# Patient Record
Sex: Female | Born: 1980 | Race: White | Hispanic: No | Marital: Married | Smoking: Never smoker
Health system: Southern US, Community
[De-identification: ages and names within clinical notes are randomized; demographics above are authoritative.]

## PROBLEM LIST (undated history)

## (undated) DIAGNOSIS — T7840XA Allergy, unspecified, initial encounter: Secondary | ICD-10-CM

## (undated) DIAGNOSIS — Z9851 Tubal ligation status: Secondary | ICD-10-CM

## (undated) DIAGNOSIS — N3020 Other chronic cystitis without hematuria: Secondary | ICD-10-CM

## (undated) DIAGNOSIS — B019 Varicella without complication: Secondary | ICD-10-CM

## (undated) DIAGNOSIS — N393 Stress incontinence (female) (male): Secondary | ICD-10-CM

## (undated) DIAGNOSIS — O4700 False labor before 37 completed weeks of gestation, unspecified trimester: Secondary | ICD-10-CM

## (undated) DIAGNOSIS — Z9882 Breast implant status: Secondary | ICD-10-CM

## (undated) DIAGNOSIS — A6000 Herpesviral infection of urogenital system, unspecified: Secondary | ICD-10-CM

## (undated) DIAGNOSIS — H9390 Unspecified disorder of ear, unspecified ear: Secondary | ICD-10-CM

## (undated) DIAGNOSIS — R011 Cardiac murmur, unspecified: Secondary | ICD-10-CM

## (undated) DIAGNOSIS — Z0000 Encounter for general adult medical examination without abnormal findings: Secondary | ICD-10-CM

## (undated) DIAGNOSIS — H6692 Otitis media, unspecified, left ear: Secondary | ICD-10-CM

## (undated) HISTORY — DX: Stress incontinence (female) (male): N39.3

## (undated) HISTORY — DX: Unspecified disorder of ear, unspecified ear: H93.90

## (undated) HISTORY — DX: Varicella without complication: B01.9

## (undated) HISTORY — DX: Herpesviral infection of urogenital system, unspecified: A60.00

## (undated) HISTORY — DX: Encounter for general adult medical examination without abnormal findings: Z00.00

## (undated) HISTORY — DX: Allergy, unspecified, initial encounter: T78.40XA

## (undated) HISTORY — DX: False labor before 37 completed weeks of gestation, unspecified trimester: O47.00

## (undated) HISTORY — DX: Otitis media, unspecified, left ear: H66.92

## (undated) HISTORY — DX: Cardiac murmur, unspecified: R01.1

## (undated) HISTORY — DX: Other chronic cystitis without hematuria: N30.20

---
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
## 2004-09-24 ENCOUNTER — Other Ambulatory Visit: Admission: RE | Admit: 2004-09-24 | Discharge: 2004-09-24 | Payer: Self-pay | Admitting: Obstetrics and Gynecology

## 2006-04-04 ENCOUNTER — Other Ambulatory Visit: Admission: RE | Admit: 2006-04-04 | Discharge: 2006-04-04 | Payer: Self-pay | Admitting: Obstetrics and Gynecology

## 2006-08-01 HISTORY — PX: BREAST ENHANCEMENT SURGERY: SHX7

## 2006-11-02 ENCOUNTER — Encounter: Admission: RE | Admit: 2006-11-02 | Discharge: 2006-11-02 | Payer: Self-pay | Admitting: Internal Medicine

## 2007-07-19 ENCOUNTER — Other Ambulatory Visit: Admission: RE | Admit: 2007-07-19 | Discharge: 2007-07-19 | Payer: Self-pay | Admitting: Obstetrics and Gynecology

## 2008-03-01 IMAGING — CT CT ABDOMEN W/ CM
2 of 5 series · 17 of 46 positions shown, 19 images · IV contrast (omnipaque)
Comparison: none

CLINICAL DATA: 25-year-old female, epigastric abdominal pain and nausea for 6 weeks. 
ABDOMEN CT WITH CONTRAST:
TECHNIQUE: Multidetector CT imaging of the abdomen was performed following the standard protocol during bolus administration of intravenous contrast.
Contrast:  100 cc Omnipaque 300
TECHNIQUE: Multidetector CT imaging of the pelvis was performed following the standard protocol during bolus administration of intravenous contrast.

[Series 3: routine abdomen · axial · 0.62mm/px · z∈[-436,-66]mm · 14 of 84 slices shown, 16 images]
[im 5/84  soft-tissue]
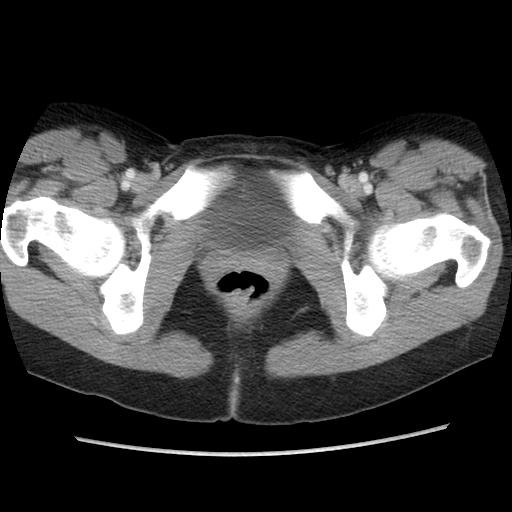
[im 5/84  bone]
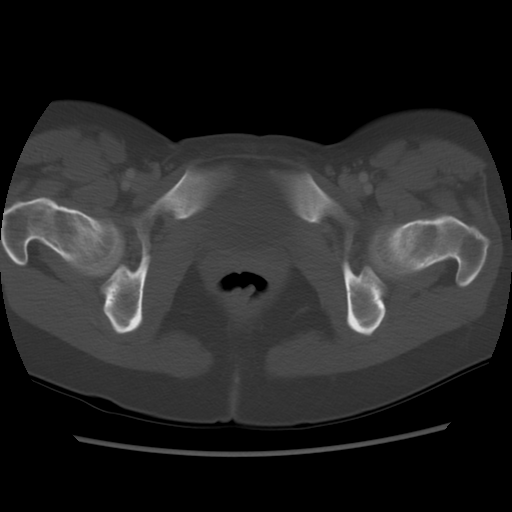
[im 10/84  soft-tissue]
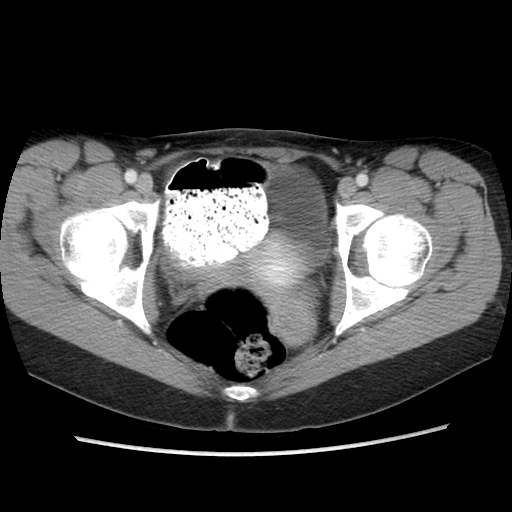
[im 15/84  soft-tissue]
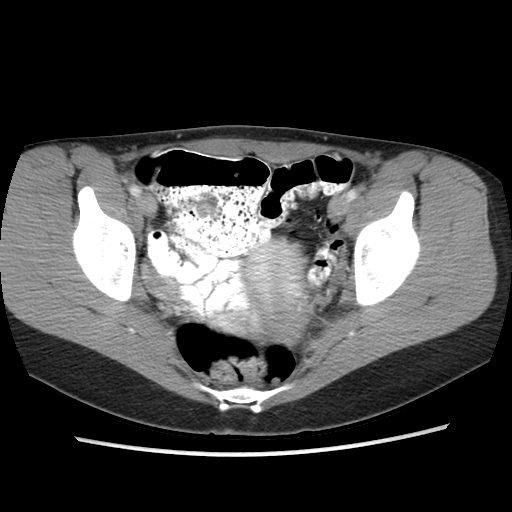
[im 25/84  soft-tissue]
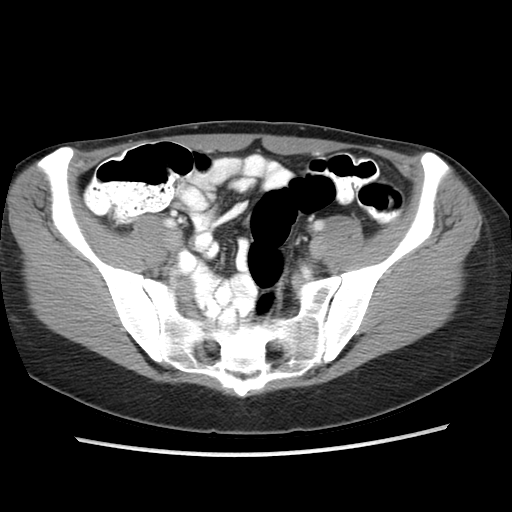
[im 30/84  soft-tissue]
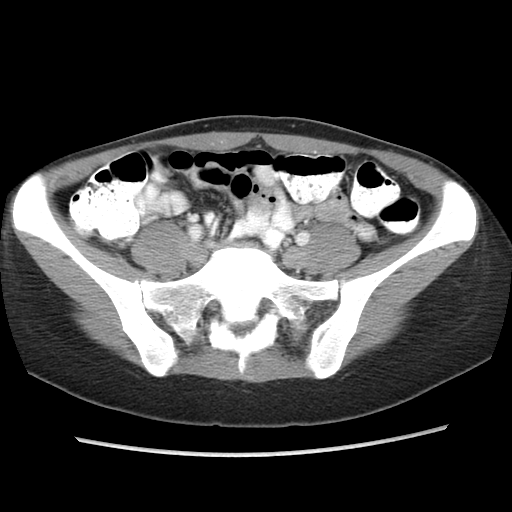
[im 35/84  soft-tissue]
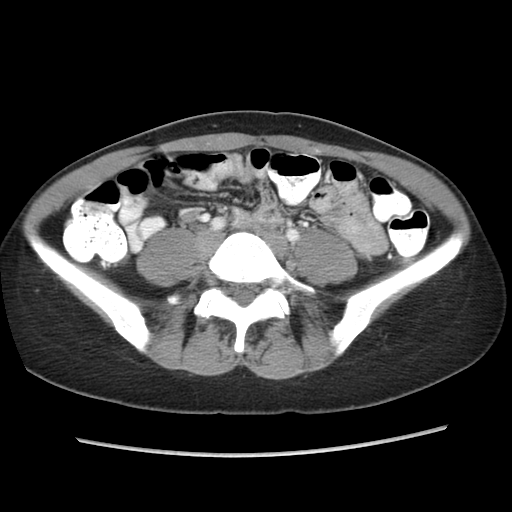
[im 40/84  soft-tissue]
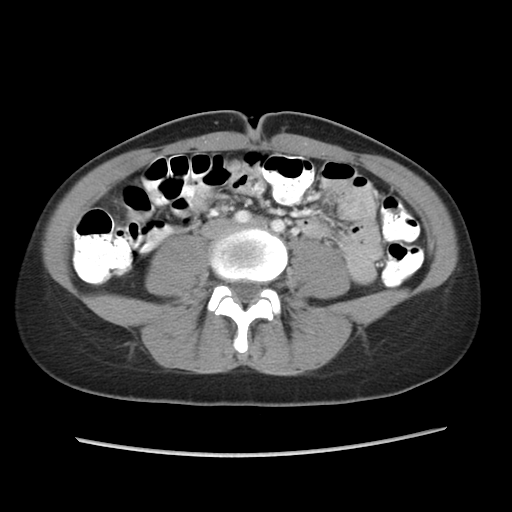
[im 44/84  soft-tissue]
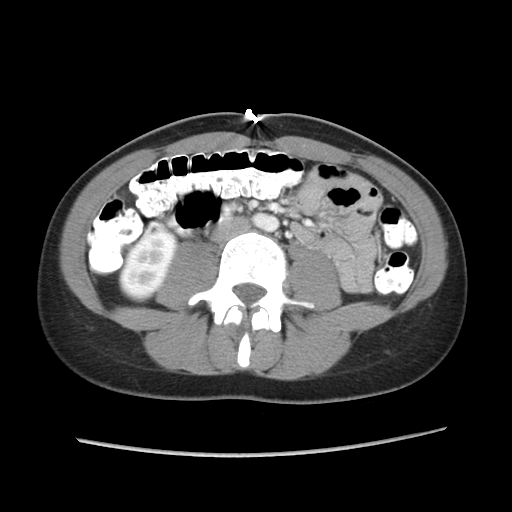
[im 49/84  soft-tissue]
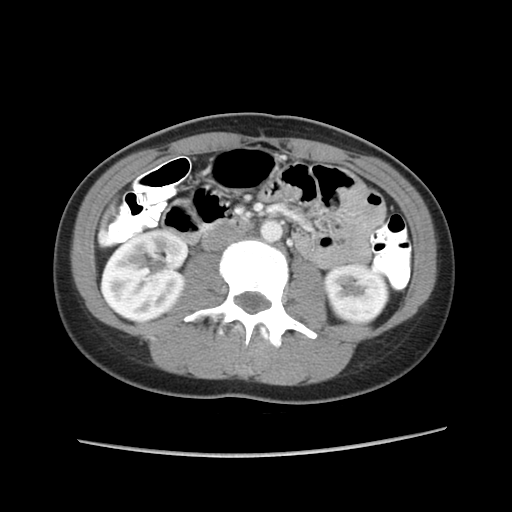
[im 49/84  bone]
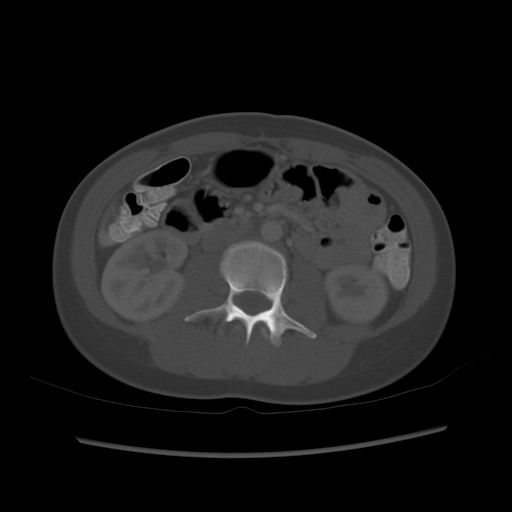
[im 54/84  soft-tissue]
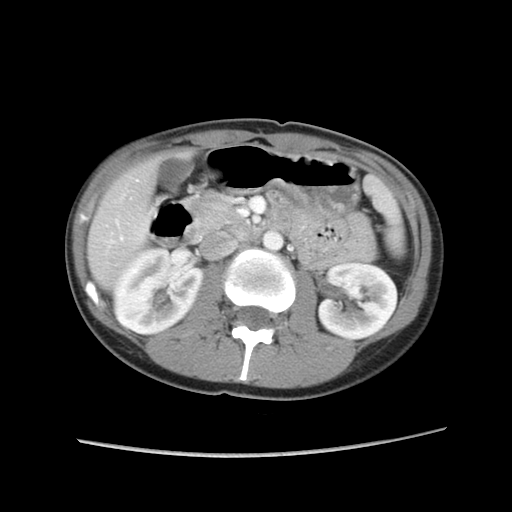
[im 64/84  soft-tissue]
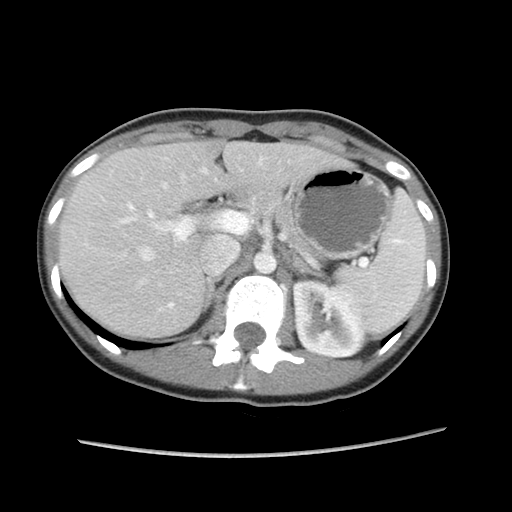
[im 69/84  soft-tissue]
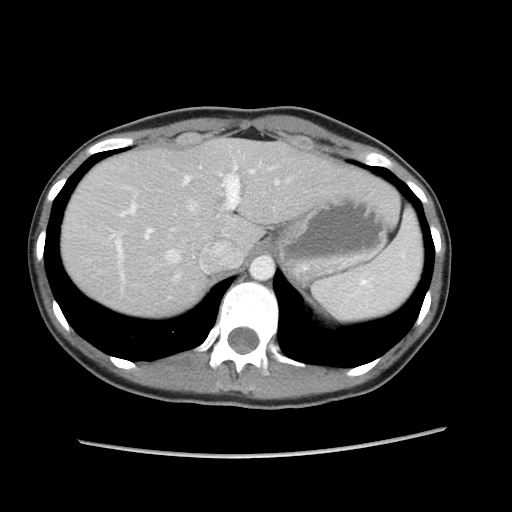
[im 74/84  soft-tissue]
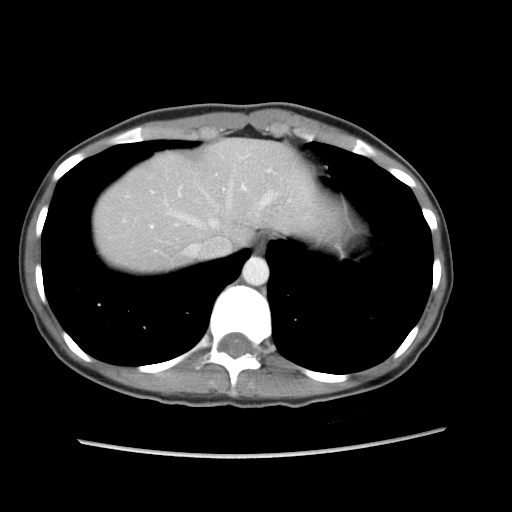
[im 79/84  soft-tissue]
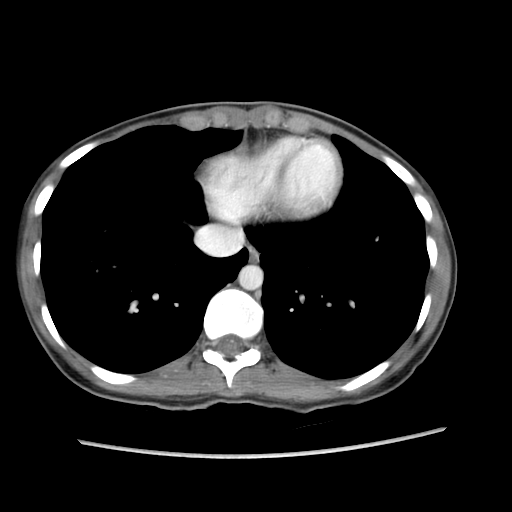

[Series 602: sagittal body · sagittal · 0.86mm/px · 3 of 129 slices shown]
[im 43/129  soft-tissue]
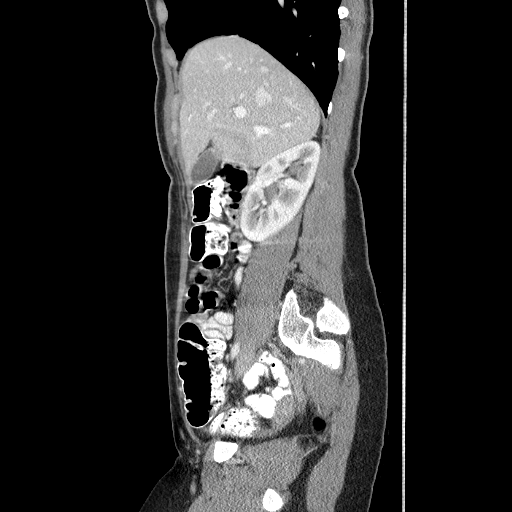
[im 57/129  soft-tissue]
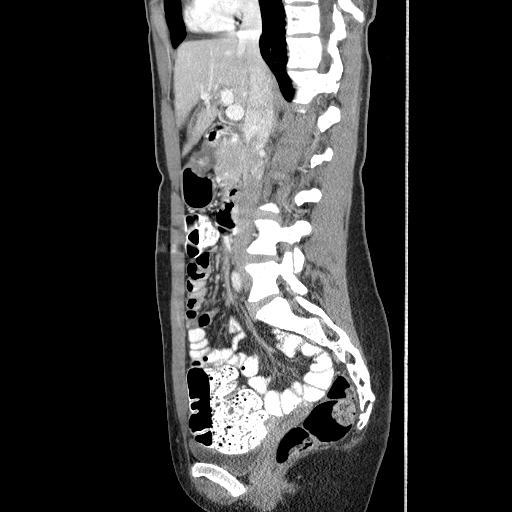
[im 72/129  soft-tissue]
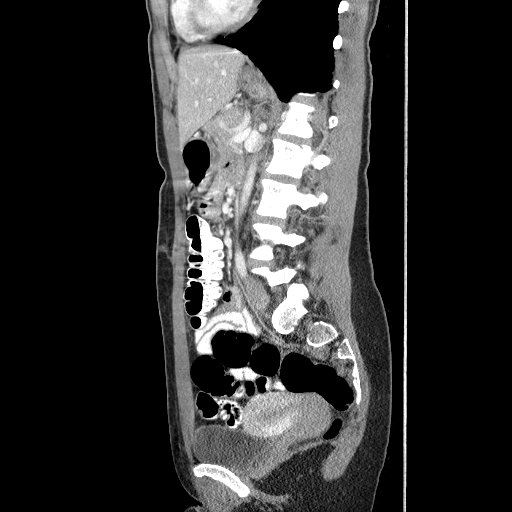

[17 of 46 positions shown; findings below may reference images not displayed]

FINDINGS: Lung bases are clear.  No pericardial or pleural effusion.  Normal heart size.  No hiatal hernia.  
In the abdomen, the liver, gallbladder, biliary system, adrenal glands, kidneys, pancreas, and spleen are normal.  No acute abdominal inflammation, ascites, fluid collection, abscess, adenopathy, bowel obstruction, or free air.
IMPRESSION: No acute intraabdominal finding. 
PELVIS CT WITH CONTRAST:
FINDINGS: The cecum is low-lying within the pelvis.  Otherwise there is no acute pelvic inflammation, fluid collection, free fluid, adenopathy, or definite bowel abnormality.  The appendix is not visualized.
IMPRESSION: No acute intrapelvic finding.

## 2008-09-07 ENCOUNTER — Inpatient Hospital Stay (HOSPITAL_COMMUNITY): Admission: AD | Admit: 2008-09-07 | Discharge: 2008-09-07 | Payer: Self-pay | Admitting: Obstetrics & Gynecology

## 2008-09-09 ENCOUNTER — Inpatient Hospital Stay (HOSPITAL_COMMUNITY): Admission: AD | Admit: 2008-09-09 | Discharge: 2008-09-10 | Payer: Self-pay | Admitting: Obstetrics

## 2008-09-10 ENCOUNTER — Inpatient Hospital Stay (HOSPITAL_COMMUNITY): Admission: AD | Admit: 2008-09-10 | Discharge: 2008-09-12 | Payer: Self-pay | Admitting: Obstetrics

## 2010-11-16 LAB — CBC
HCT: 44.5 % (ref 36.0–46.0)
Hemoglobin: 11.6 g/dL — ABNORMAL LOW (ref 12.0–15.0)
MCHC: 34.4 g/dL (ref 30.0–36.0)
Platelets: 174 10*3/uL (ref 150–400)
RBC: 3.61 MIL/uL — ABNORMAL LOW (ref 3.87–5.11)
WBC: 14.2 10*3/uL — ABNORMAL HIGH (ref 4.0–10.5)

## 2011-08-03 ENCOUNTER — Ambulatory Visit: Payer: Self-pay | Admitting: Family Medicine

## 2011-08-15 ENCOUNTER — Encounter: Payer: Self-pay | Admitting: Family Medicine

## 2011-08-15 ENCOUNTER — Ambulatory Visit (INDEPENDENT_AMBULATORY_CARE_PROVIDER_SITE_OTHER): Payer: Self-pay | Admitting: Family Medicine

## 2011-08-15 VITALS — BP 106/71 | HR 71 | Temp 98.0°F | Ht 66.5 in | Wt 121.8 lb

## 2011-08-15 DIAGNOSIS — J45909 Unspecified asthma, uncomplicated: Secondary | ICD-10-CM

## 2011-08-15 DIAGNOSIS — T7840XA Allergy, unspecified, initial encounter: Secondary | ICD-10-CM

## 2011-08-15 DIAGNOSIS — Z23 Encounter for immunization: Secondary | ICD-10-CM

## 2011-08-15 DIAGNOSIS — Z0000 Encounter for general adult medical examination without abnormal findings: Secondary | ICD-10-CM

## 2011-08-15 HISTORY — DX: Encounter for general adult medical examination without abnormal findings: Z00.00

## 2011-08-15 HISTORY — DX: Allergy, unspecified, initial encounter: T78.40XA

## 2011-08-15 MED ORDER — TETANUS-DIPHTH-ACELL PERTUSSIS 5-2.5-18.5 LF-MCG/0.5 IM SUSP: 0.5000 mL | Freq: Once | INTRAMUSCULAR | Status: DC

## 2011-08-15 MED ORDER — ALBUTEROL SULFATE HFA 108 (90 BASE) MCG/ACT IN AERS: 2.0000 | INHALATION_SPRAY | Freq: Four times a day (QID) | RESPIRATORY_TRACT | Status: DC | PRN

## 2011-08-15 NOTE — Assessment & Plan Note (Signed)
Given Tdap and flu shots today. Patient declines any labs today but agrees to have them drawn prior to next years check up. Given handout on preventative measures over years

## 2011-08-15 NOTE — Progress Notes (Signed)
Patient ID: Tina Turner, female   DOB: [birth year], 31 y.o.   MRN: [id number] Tina Turner [id number] 03/12/1981 08/15/2011      Progress Note New Patient  Subjective  Chief Complaint  Chief Complaint  Patient presents with  . Establish Care    new patient    HPI  She is a 31 year old Caucasian female in today for new patient appointment. She is generally in good health and is in need of a primary care doctor. She's had no recent illness, fevers, chills, chest pain, palpitations, headache, ear pain, tinnitus, GI or GU complaints. She has a history of a ruptured tympanic membrane on the right which was patched and she believes busted again a few years after it happened in 1997. No pain or discharge currently or since then. She does follow with GYN. She had childhood asthma requiring controlling medications but did better until last 2 years when she started to struggle with coughing and wheezing the spring and fall. No symptoms at present.  Past Medical History  Diagnosis Date  . Chicken pox as a child  . Asthma     childhood, seasonal with allergies now.cough variant  . Allergy     Fall and Spring  . Allergic state 08/15/2011  . Preventative health care 08/15/2011    Past Surgical History  Procedure Date  . Inner ear surgery 1997    ear drum busted- right  . Breast enhancement surgery 2008    Family History  Problem Relation Age of Onset  . Hypertension Mother   . Heart disease Father     2 stents  . Alzheimer's disease Maternal Grandmother   . Cancer Maternal Grandmother     ovarian and skin, melanoma  . Dementia Maternal Grandmother     Alzheimer's  . Heart disease Maternal Grandfather   . Heart attack Maternal Grandfather   . Diabetes Paternal Grandfather     type 2    History   Social History  . Marital Status: Married    Spouse Name: N/A    Number of Children: N/A  . Years of Education: N/A   Occupational History  . Not on file.   Social  History Main Topics  . Smoking status: Never Smoker   . Smokeless tobacco: Never Used  . Alcohol Use: Yes     1-2 drinks weekly  . Drug Use: No  . Sexually Active: Yes -- Female partner(s)   Other Topics Concern  . Not on file   Social History Narrative  . No narrative on file    No current outpatient prescriptions on file prior to visit.   No current facility-administered medications on file prior to visit.    No Known Allergies  Review of Systems  Review of Systems  Constitutional: Negative for fever, chills and malaise/fatigue.  HENT: Negative for hearing loss, nosebleeds and congestion.   Eyes: Negative for discharge.  Respiratory: Negative for cough, sputum production, shortness of breath and wheezing.   Cardiovascular: Negative for chest pain, palpitations and leg swelling.  Gastrointestinal: Negative for heartburn, nausea, vomiting, abdominal pain, diarrhea, constipation and blood in stool.  Genitourinary: Negative for dysuria, urgency, frequency and hematuria.  Musculoskeletal: Negative for myalgias, back pain and falls.  Skin: Negative for rash.  Neurological: Negative for dizziness, tremors, sensory change, focal weakness, loss of consciousness, weakness and headaches.  Endo/Heme/Allergies: Negative for polydipsia. Does not bruise/bleed easily.  Psychiatric/Behavioral: Negative for depression and suicidal ideas. The patient is not nervous/anxious and does  not have insomnia.     Objective  BP 106/71  Pulse 71  Temp(Src) 98 F (36.7 C) (Temporal)  Ht 5' 6.5" (1.689 m)  Wt 121 lb 12.8 oz (55.248 kg)  BMI 19.36 kg/m2  SpO2 100%  LMP 07/17/2011  Physical Exam  Physical Exam  Constitutional: She is oriented to person, place, and time and well-developed, well-nourished, and in no distress. No distress.  HENT:  Head: Normocephalic and atraumatic.  Right Ear: External ear normal.  Left Ear: External ear normal.  Nose: Nose normal.  Mouth/Throat: Oropharynx  is clear and moist. No oropharyngeal exudate.       Right TM with small hole, no discharge, erythema or fluctuance  Eyes: Conjunctivae and EOM are normal. Pupils are equal, round, and reactive to light. Right eye exhibits no discharge. Left eye exhibits no discharge. No scleral icterus.  Neck: Normal range of motion. Neck supple. No thyromegaly present.  Cardiovascular: Normal rate, regular rhythm, normal heart sounds and intact distal pulses.   No murmur heard. Pulmonary/Chest: Effort normal and breath sounds normal. No respiratory distress. She has no wheezes. She has no rales.  Abdominal: Soft. Bowel sounds are normal. She exhibits no distension and no mass. There is no tenderness.  Musculoskeletal: Normal range of motion. She exhibits no edema and no tenderness.  Lymphadenopathy:    She has no cervical adenopathy.  Neurological: She is alert and oriented to person, place, and time. She has normal reflexes. No cranial nerve deficit. Coordination normal.  Skin: Skin is warm and dry. No rash noted. She is not diaphoretic.  Psychiatric: Mood, memory and affect normal.       Assessment & Plan  Preventative health care Given Tdap and flu shots today. Patient declines any labs today but agrees to have them drawn prior to next years check up. Given handout on preventative measures over years  Asthma Patient had severe asthma as a kid but has been doing better since being a grown up. She is noting over the past couple of years getting wheezing and coughing in the spring and fall. She is advised to start daily Zyrtec 1-2 weeks prior to onset of typical symptoms and given Albuterol to use prn. If needing more than twice weekly Albuterol with this approach she is to call for a prescription of Singulair  Allergic state Start with Zyrtec daily prn and call if symptoms persistent

## 2011-08-15 NOTE — Assessment & Plan Note (Signed)
Start with Zyrtec daily prn and call if symptoms persistent

## 2011-08-15 NOTE — Patient Instructions (Signed)

## 2011-08-15 NOTE — Assessment & Plan Note (Signed)
Patient had severe asthma as a kid but has been doing better since being a grown up. She is noting over the past couple of years getting wheezing and coughing in the spring and fall. She is advised to start daily Zyrtec 1-2 weeks prior to onset of typical symptoms and given Albuterol to use prn. If needing more than twice weekly Albuterol with this approach she is to call for a prescription of Singulair

## 2011-09-28 ENCOUNTER — Telehealth: Payer: Self-pay

## 2011-09-28 MED ORDER — FLUCONAZOLE 150 MG PO TABS: 150.0000 mg | ORAL_TABLET | Freq: Once | ORAL | Status: DC

## 2011-09-28 NOTE — Telephone Encounter (Signed)
Patient left a message stating that she feels like she is starting to get a yeast infection? Patient would like to know if she needs to come in for an appt or something could be called in for her? Pts callback [phone]

## 2011-09-28 NOTE — Telephone Encounter (Signed)
Pt informed and RX sent 

## 2011-09-28 NOTE — Telephone Encounter (Signed)
Ok to call in diflucan 150 mg 1 tab po once but if no improvement she should get looked at would also have her start a probiotic daily

## 2011-11-07 ENCOUNTER — Ambulatory Visit (INDEPENDENT_AMBULATORY_CARE_PROVIDER_SITE_OTHER): Payer: PRIVATE HEALTH INSURANCE | Admitting: Family Medicine

## 2011-11-07 ENCOUNTER — Encounter: Payer: Self-pay | Admitting: Family Medicine

## 2011-11-07 VITALS — BP 113/71 | HR 79 | Temp 99.2°F | Ht 66.5 in | Wt 126.1 lb

## 2011-11-07 DIAGNOSIS — H6690 Otitis media, unspecified, unspecified ear: Secondary | ICD-10-CM

## 2011-11-07 DIAGNOSIS — N760 Acute vaginitis: Secondary | ICD-10-CM

## 2011-11-07 DIAGNOSIS — H9390 Unspecified disorder of ear, unspecified ear: Secondary | ICD-10-CM | POA: Insufficient documentation

## 2011-11-07 DIAGNOSIS — T7840XA Allergy, unspecified, initial encounter: Secondary | ICD-10-CM

## 2011-11-07 DIAGNOSIS — H6692 Otitis media, unspecified, left ear: Secondary | ICD-10-CM

## 2011-11-07 HISTORY — DX: Otitis media, unspecified, left ear: H66.92

## 2011-11-07 MED ORDER — AMOXICILLIN-POT CLAVULANATE 875-125 MG PO TABS: 1.0000 | ORAL_TABLET | Freq: Two times a day (BID) | ORAL | Status: AC

## 2011-11-07 MED ORDER — ANTIPYRINE-BENZOCAINE 5.4-1.4 % OT SOLN: 3.0000 [drp] | Freq: Four times a day (QID) | OTIC | Status: AC | PRN

## 2011-11-07 MED ORDER — FLUCONAZOLE 150 MG PO TABS: ORAL_TABLET | ORAL | Status: DC

## 2011-11-07 MED ORDER — FLUTICASONE PROPIONATE 50 MCG/ACT NA SUSP: 2.0000 | Freq: Every day | NASAL | Status: DC

## 2011-11-07 MED ORDER — LORATADINE 10 MG PO TABS: 10.0000 mg | ORAL_TABLET | Freq: Every day | ORAL | Status: DC | PRN

## 2011-11-07 NOTE — Progress Notes (Signed)
Patient ID: Tina Turner, female   DOB: 03-Nov-1980, 31 y.o.   MRN: [id number] KYLIEGH JESTER [id number] 1980/08/03 11/07/2011      Progress Note-Follow Up  Subjective  Chief Complaint  Chief Complaint  Patient presents with  . Sinusitis    X 4 days- runny nose (green), left ear has alot of pressure and pain    HPI  Patient is a 31 yo caucasian female in today  For recurrence of left ear pain, decreased hearing and nasal congestion productive of yellow green sputum. Mild cough but no sore throat or no chest congestion. No SOB/wheezing/palpitations/GI or GU c/o. Some PND noted, No fevers, chills. Has been using Tylenol severe congestion and Mucinex alternately with mild improvement in symptoms temporarily  Past Medical History  Diagnosis Date  . Chicken pox as a child  . Asthma     childhood, seasonal with allergies now.cough variant  . Allergy     Fall and Spring  . Allergic state 08/15/2011  . Preventative health care 08/15/2011  . Ear problem     hole in right ear drum  . Otitis media of left ear 11/07/2011    Past Surgical History  Procedure Date  . Inner ear surgery 1997    ear drum busted- right  . Breast enhancement surgery 2008    Family History  Problem Relation Age of Onset  . Hypertension Mother   . Heart disease Father     2 stents  . Alzheimer's disease Maternal Grandmother   . Cancer Maternal Grandmother     ovarian and skin, melanoma  . Dementia Maternal Grandmother     Alzheimer's  . Heart disease Maternal Grandfather   . Heart attack Maternal Grandfather   . Diabetes Paternal Grandfather     type 2    History   Social History  . Marital Status: Married    Spouse Name: N/A    Number of Children: N/A  . Years of Education: N/A   Occupational History  . Not on file.   Social History Main Topics  . Smoking status: Never Smoker   . Smokeless tobacco: Never Used  . Alcohol Use: Yes     1-2 drinks weekly  . Drug Use: No  . Sexually  Active: Yes -- Female partner(s)   Other Topics Concern  . Not on file   Social History Narrative  . No narrative on file    Current Outpatient Prescriptions on File Prior to Visit  Medication Sig Dispense Refill  . albuterol (VENTOLIN HFA) 108 (90 BASE) MCG/ACT inhaler Inhale 2 puffs into the lungs every 6 (six) hours as needed for wheezing.  1 Inhaler  2  . fluticasone (FLONASE) 50 MCG/ACT nasal spray Place 2 sprays into the nose daily.  16 g  6  . loratadine (CLARITIN) 10 MG tablet Take 1 tablet (10 mg total) by mouth daily as needed for allergies.  30 tablet  11    No Known Allergies  Review of Systems  Review of Systems  Constitutional: Positive for malaise/fatigue. Negative for fever.  HENT: Positive for hearing loss, ear pain and congestion.   Eyes: Negative for discharge.  Respiratory: Positive for cough. Negative for shortness of breath.   Cardiovascular: Negative for chest pain, palpitations and leg swelling.  Gastrointestinal: Negative for nausea, abdominal pain and diarrhea.  Genitourinary: Negative for dysuria.  Musculoskeletal: Negative for falls.  Skin: Negative for rash.  Neurological: Negative for loss of consciousness and headaches.  Endo/Heme/Allergies: Negative  for polydipsia.  Psychiatric/Behavioral: Negative for depression and suicidal ideas. The patient is not nervous/anxious and does not have insomnia.     Objective  BP 113/71  Pulse 79  Temp(Src) 99.2 F (37.3 C) (Temporal)  Ht 5' 6.5" (1.689 m)  Wt 126 lb 1.9 oz (57.208 kg)  BMI 20.05 kg/m2  SpO2 100%  LMP 10/10/2011  Physical Exam  Physical Exam  Constitutional: She is oriented to person, place, and time and well-developed, well-nourished, and in no distress. No distress.  HENT:  Head: Normocephalic and atraumatic.       Right ear drum perforated, left TM erythematous, dull and retracted, nasal mucosa boggy and erythematous  Eyes: Conjunctivae are normal.  Neck: Neck supple. No  thyromegaly present.  Cardiovascular: Normal rate, regular rhythm and normal heart sounds.   No murmur heard. Pulmonary/Chest: Effort normal and breath sounds normal. She has no wheezes.  Abdominal: She exhibits no distension and no mass.  Musculoskeletal: She exhibits no edema.  Lymphadenopathy:    She has no cervical adenopathy.  Neurological: She is alert and oriented to person, place, and time.  Skin: Skin is warm and dry. No rash noted. She is not diaphoretic.  Psychiatric: Memory, affect and judgment normal.    No results found for this basename: TSH   Lab Results  Component Value Date   WBC 16.9* 09/11/2008   HGB 11.6 DELTA CHECK NOTED* 09/11/2008   HCT 33.7* 09/11/2008   MCV 93.5 09/11/2008   PLT 150 09/11/2008     Assessment & Plan  Allergic state Continue OTC antihistamines and add Fluticasone nasal spray  Otitis media of left ear And sinusitis, add Mucinex, increase rest and fluids, start Augmentin and given Auralgan drops to use prn

## 2011-11-07 NOTE — Assessment & Plan Note (Signed)
Continue OTC antihistamines and add Fluticasone nasal spray

## 2011-11-07 NOTE — Assessment & Plan Note (Signed)
And sinusitis, add Mucinex, increase rest and fluids, start Augmentin and given Auralgan drops to use prn

## 2011-11-07 NOTE — Patient Instructions (Signed)
Otitis Media You or your child has otitis media. This is an infection of the middle chamber of the ear. This condition is common in young children and often follows upper respiratory infections. Symptoms of otitis media may include earache or ear fullness, hearing loss, or fever. If the eardrum ruptures, a middle ear infection may also cause bloody or pus-like discharge from the ear. Fussiness, irritability, and persistent crying may be the only signs of otitis media in small children. Otitis media can be caused by a bacteria or a virus. Antibiotics may be used to treat bacterial otitis media. But antibiotics are not effective against viral infections. Not every case of bacterial otitis media requires antibiotics and depending on age, severity of infection, and other risk factors, observation may be all that is required. Ear drops or oral medicines may be prescribed to reduce pain, fever, or congestion. Babies with ear infections should not be fed while lying on their backs. This increases the pressure and pain in the ear. Do not put cotton in the ear canal or clean it with cotton swabs. Swimming should be avoided if the eardrum has ruptured or if there is drainage from the ear canal. If your child experiences recurrent infections, your child may need to be referred to an Ear, Nose, and Throat specialist. HOME CARE INSTRUCTIONS   Take any antibiotic as directed by your caregiver. You or your child may feel better in a few days, but take all medicine or the infection may not respond and may become more difficult to treat.   Only take over-the-counter or prescription medicines for pain, discomfort, or fever as directed by your caregiver. Do not give aspirin to children.  Otitis media can lead to complications including rupture of the eardrum, long-term hearing loss, and more severe infections. Call your caregiver for follow-up care at the end of treatment. SEEK IMMEDIATE MEDICAL CARE IF:   Your or your  child's problems do not improve within 2 to 3 days.   You or your child has an oral temperature above 102 F (38.9 C), not controlled by medicine.   Your baby is older than 3 months with a rectal temperature of 102 F (38.9 C) or higher.   Your baby is 53 months old or younger with a rectal temperature of 100.4 F (38 C) or higher.   Your child develops increased fussiness.   You or your child develops a stiff neck, severe headache, or confusion.   There is swelling around the ear.   There is dizziness, vomiting, unusual sleepiness, seizures, or twitching of facial muscles.   The pain or ear drainage persists beyond 2 days of antibiotic treatment.  Document Released: 08/25/2004 Document Revised: 07/07/2011 Document Reviewed: 11/13/2009 [facility] Patient Information 2012 [place], [place].   Consider Netty pot or nasal saline flushes twice daily and probiotic such as Align caps daily Plain Mucinex twice daily for 10 days Take at least 10 days of the antibiotics Can consider switch from Claritin to zyrtec

## 2011-12-29 ENCOUNTER — Ambulatory Visit (INDEPENDENT_AMBULATORY_CARE_PROVIDER_SITE_OTHER): Payer: PRIVATE HEALTH INSURANCE | Admitting: Family Medicine

## 2011-12-29 ENCOUNTER — Encounter: Payer: Self-pay | Admitting: Family Medicine

## 2011-12-29 VITALS — BP 103/71 | HR 95 | Temp 98.6°F | Ht 66.5 in | Wt 120.8 lb

## 2011-12-29 DIAGNOSIS — H6690 Otitis media, unspecified, unspecified ear: Secondary | ICD-10-CM

## 2011-12-29 DIAGNOSIS — J45909 Unspecified asthma, uncomplicated: Secondary | ICD-10-CM

## 2011-12-29 DIAGNOSIS — H6692 Otitis media, unspecified, left ear: Secondary | ICD-10-CM

## 2011-12-29 DIAGNOSIS — J40 Bronchitis, not specified as acute or chronic: Secondary | ICD-10-CM

## 2011-12-29 DIAGNOSIS — T7840XA Allergy, unspecified, initial encounter: Secondary | ICD-10-CM

## 2011-12-29 MED ORDER — ALBUTEROL SULFATE HFA 108 (90 BASE) MCG/ACT IN AERS: 2.0000 | INHALATION_SPRAY | Freq: Four times a day (QID) | RESPIRATORY_TRACT | Status: AC | PRN

## 2011-12-29 MED ORDER — FLUTICASONE PROPIONATE 50 MCG/ACT NA SUSP: 2.0000 | Freq: Every day | NASAL | Status: DC

## 2011-12-29 MED ORDER — GUAIFENESIN ER 600 MG PO TB12: 600.0000 mg | ORAL_TABLET | Freq: Two times a day (BID) | ORAL | Status: DC

## 2011-12-29 MED ORDER — MONTELUKAST SODIUM 10 MG PO TABS: 10.0000 mg | ORAL_TABLET | Freq: Every day | ORAL | Status: DC

## 2011-12-29 MED ORDER — AZITHROMYCIN 250 MG PO TABS: ORAL_TABLET | ORAL | Status: AC

## 2011-12-29 NOTE — Patient Instructions (Signed)
Asthma, Acute Bronchospasm  Your exam shows you have asthma, or acute bronchospasm that acts like asthma. Bronchospasm means your air passages become narrowed. These conditions are due to inflammation and airway spasm that cause narrowing of the bronchial tubes in the lungs. This causes you to have wheezing and shortness of breath.  CAUSES    Respiratory infections and allergies most often bring on these attacks. Smoking, air pollution, cold air, emotional upsets, and vigorous exercise can also bring them on.    TREATMENT     Treatment is aimed at making the narrowed airways larger. Mild asthma/bronchospasm is usually controlled with inhaled medicines. Albuterol is a common medicine that you breathe in to open spastic or narrowed airways. Some trade names for albuterol are Ventolin or Proventil. Steroid medicine is also used to reduce the inflammation when an attack is moderate or severe. Antibiotics (medications used to kill germs) are only used if a bacterial infection is present.    If you are pregnant and need to use Albuterol (Ventolin or Proventil), you can expect the baby to move more than usual shortly after the medicine is used.   HOME CARE INSTRUCTIONS     Rest.    Drink plenty of liquids. This helps the mucus to remain thin and easily coughed up. Do not use caffeine or alcohol.    Do not smoke. Avoid being exposed to second-hand smoke.    You play a critical role in keeping yourself in good health. Avoid exposure to things that cause you to wheeze. Avoid exposure to things that cause you to have breathing problems. Keep your medications up-to-date and available. Carefully follow your doctor's treatment plan.    When pollen or pollution is bad, keep windows closed and use an air conditioner go to places with air conditioning. If you are allergic to furry pets or birds, find new homes for them or keep them outside.    Take your medicine exactly as prescribed.     Asthma requires careful medical attention. See your caregiver for follow-up as advised. If you are more than [redacted] weeks pregnant and you were prescribed any new medications, let your Obstetrician know about the visit and how you are doing. Arrange a recheck.   SEEK IMMEDIATE MEDICAL CARE IF:     You are getting worse.    You have trouble breathing. If severe, call 911.    You develop chest pain or discomfort.    You are throwing up or not drinking fluids.    You are not getting better within 24 hours.    You are coughing up yellow, green, brown, or bloody sputum.    You develop a fever over 102 F (38.9 C).    You have trouble swallowing.   MAKE SURE YOU:     Understand these instructions.    Will watch your condition.    Will get help right away if you are not doing well or get worse.   Document Released: 11/02/2006 Document Revised: 07/07/2011 Document Reviewed: 07/02/2007  [employer] Patient Information 2012 [employer]

## 2011-12-29 NOTE — Assessment & Plan Note (Addendum)
Has been flared recently, is out of her Albuterol this is refilled today. Appears to be flared by allergies and is worse qhs. Is encouraged to cover bedding with dust mite covers, Add Singulair 10 mg daily. Suspect patient symptoms are allergic and asthmatic, if she worsens and develops bronchitis with fevers, thick green sputum etc, she is given a prescription for Azithromycin to use at her discretion

## 2011-12-29 NOTE — Assessment & Plan Note (Signed)
Continue Zyrtec qd and increase to Bid on a bad day. Restart Flonase and add Singulair

## 2011-12-29 NOTE — Progress Notes (Signed)
Patient ID: Tina Turner, female   DOB: 1981/02/04, 31 y.o.   MRN: [id number] Tina Turner [id number] 01/21/1981 12/29/2011      Progress Note-Follow Up  Subjective  Chief Complaint  Chief Complaint  Patient presents with  . chest congestion    X 5 days  . Cough    dry- X 5 days  . runny nose    X 5 days  . lost voice    this morning    HPI  31 year old Caucasian female who is in today complaining of roughly 5 day history of worsening respiratory symptoms. She started with chest congestion, fatigue. Cough has gone from dry to wet the distal nonproductive. It is worse at night. She has had some clear rhinorrhea and mild congestion. She notes wheezing mostly at night. She isn't sure irritation and this morning developed meningitis. She is out of albuterol. As a child scheduled with allergies and asthma she feels this is worsening in that direction again. She had a short episode of feeling flushed and possibly feverish yesterday afternoon but otherwise has not had that feeling. No chills. No nausea, anorexia, diarrhea. No chest pain or palpitations.  Past Medical History  Diagnosis Date  . Chicken pox as a child  . Asthma     childhood, seasonal with allergies now.cough variant  . Allergy     Fall and Spring  . Allergic state 08/15/2011  . Preventative health care 08/15/2011  . Ear problem     hole in right ear drum  . Otitis media of left ear 11/07/2011    Past Surgical History  Procedure Date  . Inner ear surgery 1997    ear drum busted- right  . Breast enhancement surgery 2008    Family History  Problem Relation Age of Onset  . Hypertension Mother   . Heart disease Father     2 stents  . Alzheimer's disease Maternal Grandmother   . Cancer Maternal Grandmother     ovarian and skin, melanoma  . Dementia Maternal Grandmother     Alzheimer's  . Heart disease Maternal Grandfather   . Heart attack Maternal Grandfather   . Diabetes Paternal Grandfather    type 2    History   Social History  . Marital Status: Married    Spouse Name: N/A    Number of Children: N/A  . Years of Education: N/A   Occupational History  . Not on file.   Social History Main Topics  . Smoking status: Never Smoker   . Smokeless tobacco: Never Used  . Alcohol Use: Yes     1-2 drinks weekly  . Drug Use: No  . Sexually Active: Yes -- Female partner(s)   Other Topics Concern  . Not on file   Social History Narrative  . No narrative on file    Current Outpatient Prescriptions on File Prior to Visit  Medication Sig Dispense Refill  . cetirizine (ZYRTEC) 10 MG tablet Take 10 mg by mouth daily.      . montelukast (SINGULAIR) 10 MG tablet Take 1 tablet (10 mg total) by mouth at bedtime.  30 tablet  3  . DISCONTD: albuterol (VENTOLIN HFA) 108 (90 BASE) MCG/ACT inhaler Inhale 2 puffs into the lungs every 6 (six) hours as needed for wheezing.  1 Inhaler  2  . DISCONTD: fluticasone (FLONASE) 50 MCG/ACT nasal spray Place 2 sprays into the nose daily.  16 g  6    No Known Allergies  Review of  Systems  Review of Systems  Constitutional: Positive for fever. Negative for malaise/fatigue.  HENT: Positive for congestion and sore throat.   Eyes: Negative for discharge.  Respiratory: Positive for cough, sputum production, shortness of breath and wheezing.        Clear rhinorrhea and pnd  Cardiovascular: Negative for chest pain, palpitations and leg swelling.  Gastrointestinal: Negative for nausea, abdominal pain and diarrhea.  Genitourinary: Negative for dysuria.  Musculoskeletal: Negative for falls.  Skin: Negative for rash.  Neurological: Negative for loss of consciousness and headaches.  Endo/Heme/Allergies: Negative for polydipsia.  Psychiatric/Behavioral: Negative for depression and suicidal ideas. The patient is not nervous/anxious and does not have insomnia.     Objective  BP 103/71  Pulse 95  Temp(Src) 98.6 F (37 C) (Temporal)  Ht 5' 6.5" (1.689  m)  Wt 120 lb 12.8 oz (54.795 kg)  BMI 19.21 kg/m2  SpO2 98%  LMP 12/22/2011  Physical Exam  Physical Exam  Constitutional: She is oriented to person, place, and time and well-developed, well-nourished, and in no distress. No distress.  HENT:  Head: Normocephalic and atraumatic.  Eyes: Conjunctivae are normal.  Neck: Neck supple. No thyromegaly present.  Cardiovascular: Normal rate, regular rhythm and normal heart sounds.   No murmur heard. Pulmonary/Chest: Effort normal and breath sounds normal. She has no wheezes.       Slight rhonchi, RUL, LLL  Abdominal: She exhibits no distension and no mass.  Musculoskeletal: She exhibits no edema.  Lymphadenopathy:    She has no cervical adenopathy.  Neurological: She is alert and oriented to person, place, and time.  Skin: Skin is warm and dry. No rash noted. She is not diaphoretic.  Psychiatric: Memory, affect and judgment normal.    No results found for this basename: TSH   Lab Results  Component Value Date   WBC 16.9* 09/11/2008   HGB 11.6 DELTA CHECK NOTED* 09/11/2008   HCT 33.7* 09/11/2008   MCV 93.5 09/11/2008   PLT 150 09/11/2008      Assessment & Plan  Asthma Has been flared recently, is out of her Albuterol this is refilled today. Appears to be flared by allergies and is worse qhs. Is encouraged to cover bedding with dust mite covers, Add Singulair 10 mg daily. Suspect patient symptoms are allergic and asthmatic, if she worsens and develops bronchitis with fevers, thick green sputum etc, she is given a prescription for Azithromycin to use at her discretion  Allergic state Continue Zyrtec qd and increase to Bid on a bad day. Restart Flonase and add Singulair  Otitis media of left ear Resolved with abx. Unfortunately she did get vaginitis and uti despite using probiotics

## 2011-12-29 NOTE — Assessment & Plan Note (Signed)
Resolved with abx. Unfortunately she did get vaginitis and uti despite using probiotics

## 2012-01-05 ENCOUNTER — Emergency Department (HOSPITAL_COMMUNITY): Payer: PRIVATE HEALTH INSURANCE

## 2012-01-05 ENCOUNTER — Encounter (HOSPITAL_COMMUNITY): Payer: Self-pay | Admitting: Family Medicine

## 2012-01-05 ENCOUNTER — Telehealth: Payer: Self-pay

## 2012-01-05 ENCOUNTER — Emergency Department (HOSPITAL_COMMUNITY)
Admission: EM | Admit: 2012-01-05 | Discharge: 2012-01-05 | Disposition: A | Discharge status: Home / Self Care | Payer: PRIVATE HEALTH INSURANCE | Attending: Emergency Medicine | Admitting: Emergency Medicine

## 2012-01-05 DIAGNOSIS — R55 Syncope and collapse: Secondary | ICD-10-CM | POA: Insufficient documentation

## 2012-01-05 DIAGNOSIS — J189 Pneumonia, unspecified organism: Secondary | ICD-10-CM | POA: Insufficient documentation

## 2012-01-05 DIAGNOSIS — J45909 Unspecified asthma, uncomplicated: Secondary | ICD-10-CM | POA: Insufficient documentation

## 2012-01-05 DIAGNOSIS — R0789 Other chest pain: Secondary | ICD-10-CM | POA: Insufficient documentation

## 2012-01-05 LAB — BASIC METABOLIC PANEL
BUN: 8 mg/dL (ref 6–23)
Chloride: 99 mEq/L (ref 96–112)
GFR calc Af Amer: >90 mL/min (ref >90)
Glucose, Bld: 110 mg/dL — ABNORMAL HIGH (ref 70–99)
Potassium: 3.8 mEq/L (ref 3.5–5.1)

## 2012-01-05 LAB — DIFFERENTIAL
Lymphs Abs: 1.7 10*3/uL (ref 0.7–4.0)
Monocytes Relative: 5 % (ref 3–12)
Neutro Abs: 11.7 10*3/uL — ABNORMAL HIGH (ref 1.7–7.7)
Neutrophils Relative %: 79 % — ABNORMAL HIGH (ref 43–77)

## 2012-01-05 LAB — URINE MICROSCOPIC-ADD ON

## 2012-01-05 LAB — URINALYSIS, ROUTINE W REFLEX MICROSCOPIC
Bilirubin Urine: NEGATIVE
Nitrite: NEGATIVE
Specific Gravity, Urine: 1.023 (ref 1.005–1.030)
pH: 8 (ref 5.0–8.0)

## 2012-01-05 LAB — GLUCOSE, CAPILLARY: Glucose-Capillary: 119 mg/dL — ABNORMAL HIGH (ref 70–99)

## 2012-01-05 LAB — CBC
Hemoglobin: 12.2 g/dL (ref 12.0–15.0)
RBC: 4.12 MIL/uL (ref 3.87–5.11)
WBC: 14.8 10*3/uL — ABNORMAL HIGH (ref 4.0–10.5)

## 2012-01-05 MED ORDER — DEXTROSE 5 % IV SOLN
500.0000 mg | Freq: Once | INTRAVENOUS | Status: AC
  Administered 2012-01-05: 500 mg via INTRAVENOUS
  Filled 2012-01-05: qty 500

## 2012-01-05 MED ORDER — HYDROCOD POLST-CHLORPHEN POLST 10-8 MG/5ML PO LQCR
5.0000 mL | Freq: Once | ORAL | Status: DC
  Filled 2012-01-05: qty 5

## 2012-01-05 MED ORDER — SODIUM CHLORIDE 0.9 % IV BOLUS (SEPSIS)
1000.0000 mL | Freq: Once | INTRAVENOUS | Status: AC
  Administered 2012-01-05: 1000 mL via INTRAVENOUS

## 2012-01-05 MED ORDER — HYDROCOD POLST-CHLORPHEN POLST 10-8 MG/5ML PO LQCR: 5.0000 mL | Freq: Two times a day (BID) | ORAL | Status: DC

## 2012-01-05 MED ORDER — DEXTROSE 5 % IV SOLN
1.0000 g | Freq: Once | INTRAVENOUS | Status: AC
  Administered 2012-01-05: 1 g via INTRAVENOUS
  Filled 2012-01-05: qty 10

## 2012-01-05 MED ORDER — AZITHROMYCIN 250 MG PO TABS: 250.0000 mg | ORAL_TABLET | Freq: Every day | ORAL | Status: AC

## 2012-01-05 NOTE — ED Provider Notes (Addendum)
History     CSN: [id number]  Arrival date & time 01/05/12  1828   None     Chief Complaint  Patient presents with  . Loss of Consciousness    (Consider location/radiation/quality/duration/timing/severity/associated sxs/prior treatment) HPI... syncopal episode while walking in a clients house.  Patient has not felt well for a couple weeks with a cough, low-grade fever, weakness, general malaise, night sweats, achiness, fatigue. Nothing makes symptoms better or worse. Described as moderate.   Past Medical History  Diagnosis Date  . Chicken pox as a child  . Asthma     childhood, seasonal with allergies now.cough variant  . Allergy     Fall and Spring  . Allergic state 08/15/2011  . Preventative health care 08/15/2011  . Ear problem     hole in right ear drum  . Otitis media of left ear 11/07/2011    Past Surgical History  Procedure Date  . Inner ear surgery 1997    ear drum busted- right  . Breast enhancement surgery 2008    Family History  Problem Relation Age of Onset  . Hypertension Mother   . Heart disease Father     2 stents  . Alzheimer's disease Maternal Grandmother   . Cancer Maternal Grandmother     ovarian and skin, melanoma  . Dementia Maternal Grandmother     Alzheimer's  . Heart disease Maternal Grandfather   . Heart attack Maternal Grandfather   . Diabetes Paternal Grandfather     type 2    History  Substance Use Topics  . Smoking status: Never Smoker   . Smokeless tobacco: Never Used  . Alcohol Use: Yes     1-2 drinks weekly    OB History    Grav Para Term Preterm Abortions TAB SAB Ect Mult Living                  Review of Systems  All other systems reviewed and are negative.    Allergies  Review of patient's allergies indicates no known allergies.  Home Medications   Current Outpatient Rx  Name Route Sig Dispense Refill  . ALBUTEROL SULFATE HFA 108 (90 BASE) MCG/ACT IN AERS Inhalation Inhale 2 puffs into the lungs every 6  (six) hours as needed for wheezing. 1 Inhaler 2  . CETIRIZINE HCL 10 MG PO TABS Oral Take 10 mg by mouth daily.    Marland Kitchen FLUTICASONE PROPIONATE 50 MCG/ACT NA SUSP Nasal Place 2 sprays into the nose daily. 16 g 6  . MONTELUKAST SODIUM 10 MG PO TABS Oral Take 1 tablet (10 mg total) by mouth at bedtime. 30 tablet 3    BP 114/68  Pulse 109  Temp(Src) 99.3 F (37.4 C) (Oral)  Resp 18  SpO2 99%  LMP 12/22/2011  Physical Exam  Nursing note and vitals reviewed. Constitutional: She is oriented to person, place, and time. She appears well-developed and well-nourished.  HENT:  Head: Normocephalic and atraumatic.  Eyes: Conjunctivae and EOM are normal. Pupils are equal, round, and reactive to light.  Neck: Normal range of motion. Neck supple.  Cardiovascular: Normal rate and regular rhythm.   Pulmonary/Chest: Effort normal and breath sounds normal.  Abdominal: Soft. Bowel sounds are normal.  Musculoskeletal: Normal range of motion.  Neurological: She is alert and oriented to person, place, and time.  Skin: Skin is warm and dry.  Psychiatric: She has a normal mood and affect.    ED Course  Procedures (including critical care time)  Labs Reviewed  GLUCOSE, CAPILLARY - Abnormal; Notable for the following:    Glucose-Capillary 119 (*)    All other components within normal limits  URINALYSIS, ROUTINE W REFLEX MICROSCOPIC - Abnormal; Notable for the following:    APPearance CLOUDY (*)    Ketones, ur 40 (*)    Protein, ur 30 (*)    Urobilinogen, UA 2.0 (*)    Leukocytes, UA SMALL (*)    All other components within normal limits  CBC - Abnormal; Notable for the following:    WBC 14.8 (*)    HCT 35.8 (*)    Platelets 417 (*)    All other components within normal limits  DIFFERENTIAL - Abnormal; Notable for the following:    Neutrophils Relative 79 (*)    Neutro Abs 11.7 (*)    All other components within normal limits  BASIC METABOLIC PANEL - Abnormal; Notable for the following:     Sodium 134 (*)    Glucose, Bld 110 (*)    All other components within normal limits  URINE MICROSCOPIC-ADD ON - Abnormal; Notable for the following:    Squamous Epithelial / LPF FEW (*)    Bacteria, UA MANY (*)    All other components within normal limits  PREGNANCY, URINE  MONONUCLEOSIS SCREEN  POCT PREGNANCY, URINE   No results found.   No diagnosis found.   Date: 01/05/2012  Rate: 106  Rhythm: sinus tachycardia  QRS Axis: normal  Intervals: normal  ST/T Wave abnormalities: normal  Conduction Disutrbances:none  Narrative Interpretation:   Old EKG Reviewed: none available  Dg Chest 2 View  01/05/2012  *RADIOLOGY REPORT*  Clinical Data: Syncope, cough, chest tightness, history asthma  CHEST - 2 VIEW  Comparison: None  Findings: Normal heart size, mediastinal contours, and pulmonary vascularity. Minimal peribronchial thickening and hyperinflation. Left lower lobe consolidation consistent with pneumonia. Remaining lungs clear. No pleural effusion or pneumothorax. No acute osseous findings.  IMPRESSION: Left lower lobe pneumonia. Peribronchial thickening and hyperinflation question related to asthma.  Original Report Authenticated By: Lollie Marrow, M.D.   MDM    cxr shows LLL pna.  Iv rocephin, iv zithromax.  Pt is hemodynamically stable.   D/c home c po zithromax, tussionex      Donnetta Hutching, MD 01/05/12 1610  Donnetta Hutching, MD 01/05/12 [phone]

## 2012-01-05 NOTE — ED Notes (Signed)
B/p low on d/c.  Notified Dr. Adriana Simas. Standing b/p increased to 106/60 with no dizziness.  Ok to d/c home.

## 2012-01-05 NOTE — Telephone Encounter (Signed)
Pt states she is having night sweats, no fever, chills, has cough (chest) non- productive, no energy. No signs of sinus infection, no runny nose, headaches. Please advise what patient should do or does she need an appt.

## 2012-01-05 NOTE — ED Notes (Signed)
Per EMS: Pt was at work and was walking and had a syncopal episode, reports thinks she was passed out for approx 1 min. Co-workers found her. Denies neck or back pain. Reports cough x2 weeks. Her dr states it was allergies, but pt feels like it is pna.

## 2012-01-05 NOTE — ED Notes (Signed)
[id number]<YN> Expected date:<BR> Expected time: 6:26 PM<BR> Means of arrival:<BR> Comments:<BR> M80 - 30yoF Syncope, recent cough/congestion *Wheelchair capable*

## 2012-01-05 NOTE — Telephone Encounter (Signed)
Patient informed and then stated that she didn't take the Zpak. Pt informed per MD to start the Zpak. Pt voiced understanding

## 2012-01-05 NOTE — Telephone Encounter (Signed)
Patient left a message stating that her allergies have not got any better since last Thursdays appt. Pt also had stated that she was having some symptoms of walking pneumonia? Left a message for patient to return my call to get some more information.

## 2012-01-05 NOTE — Discharge Instructions (Signed)
Pneumonia, Adult Pneumonia is an infection of the lungs. It may be caused by a germ (virus or bacteria). Some types of pneumonia can spread easily from person to person. This can happen when you cough or sneeze. HOME CARE  Only take medicine as told by your doctor.   Take your medicine (antibiotics) as told. Finish it even if you start to feel better.   Do not smoke.   You may use a vaporizer or humidifier in your room. This can help loosen thick spit (mucus).   Sleep so you are almost sitting up (semi-upright). This helps reduce coughing.   Rest.  A shot (vaccine) can help prevent pneumonia. Shots are often advised for:  People over 3 years old.   Patients on chemotherapy.   People with long-term (chronic) lung problems.   People with immune system problems.  GET HELP RIGHT AWAY IF:   You are getting worse.   You cannot control your cough, and you are losing sleep.   You cough up blood.   Your pain gets worse, even with medicine.   You have a fever.   Any of your problems are getting worse, not better.   You have shortness of breath or chest pain.  MAKE SURE YOU:   Understand these instructions.   Will watch your condition.   Will get help right away if you are not doing well or get worse.  Document Released: 01/04/2008 Document Revised: 07/07/2011 Document Reviewed: 10/08/2010 [facility] Patient Information 2012 [place], [place].  Chris fluids, Tylenol, cough syrup, antibiotic for 5 days. Start antibiotic tomorrow evening. Off work through Sunday

## 2012-01-05 NOTE — Telephone Encounter (Signed)
She just finished a Zpak I gave her last week 2 days ago. That can continue to work for 10 days. She should increase rest and fluids, take Mucinex 600mg  2 tabs po bid x 10 more days and take what ever OTC antihistamine she likes (ie Zyrtec, Claritin or Fexofenadine) twice daily for next week and use saline nasal spray bid and prn. If no improvement she is welcome to come back in next week for reevaluation. Remind her that if this is viral antibioitics will not help and a viral illness can drag on for 3 weeks.

## 2012-08-01 NOTE — L&D Delivery Note (Signed)
Delivery Note At 3:44 PM a healthy female was delivered via Vaginal, Spontaneous Delivery (Presentation: ; Occiput Anterior).  APGAR: 8, 9; weight .   Placenta status: Intact, Spontaneous.  Cord: 3 vessels with the following complications: None.  Cord pH: Not done  Anesthesia: Epidural  Episiotomy: None Lacerations: 2nd degree Suture Repair: vicryl rapide Est. Blood Loss (mL):  250 cc  Mom to postpartum.  Baby to nursery-stable. Consent signed for PP BT/S.  Will keep epidural in place.  Zeev Deakins,MARIE-LYNE 02/20/2013, 4:15 PM

## 2012-08-03 LAB — OB RESULTS CONSOLE ABO/RH: RH Type: POSITIVE

## 2012-08-03 LAB — OB RESULTS CONSOLE HEPATITIS B SURFACE ANTIGEN: Hepatitis B Surface Ag: NEGATIVE

## 2012-08-03 LAB — OB RESULTS CONSOLE RPR: RPR: NONREACTIVE

## 2013-01-29 ENCOUNTER — Inpatient Hospital Stay (HOSPITAL_COMMUNITY): Admission: AD | Admit: 2013-01-29 | Payer: Self-pay | Source: Ambulatory Visit | Admitting: Obstetrics & Gynecology

## 2013-02-15 ENCOUNTER — Other Ambulatory Visit: Payer: Self-pay | Admitting: Obstetrics & Gynecology

## 2013-02-18 ENCOUNTER — Encounter (HOSPITAL_COMMUNITY): Payer: Self-pay | Admitting: *Deleted

## 2013-02-18 ENCOUNTER — Telehealth (HOSPITAL_COMMUNITY): Payer: Self-pay | Admitting: *Deleted

## 2013-02-18 NOTE — Telephone Encounter (Signed)
Preadmission screen  

## 2013-02-20 ENCOUNTER — Encounter (HOSPITAL_COMMUNITY): Payer: Self-pay

## 2013-02-20 ENCOUNTER — Encounter (HOSPITAL_COMMUNITY): Payer: Self-pay | Admitting: Anesthesiology

## 2013-02-20 ENCOUNTER — Inpatient Hospital Stay (HOSPITAL_COMMUNITY): Payer: PRIVATE HEALTH INSURANCE | Admitting: Anesthesiology

## 2013-02-20 ENCOUNTER — Inpatient Hospital Stay (HOSPITAL_COMMUNITY)
Admission: RE | Admit: 2013-02-20 | Discharge: 2013-02-22 | DRG: 767 | Disposition: A | Discharge status: Home / Self Care | Payer: PRIVATE HEALTH INSURANCE | Source: Ambulatory Visit | Attending: Obstetrics & Gynecology | Admitting: Obstetrics & Gynecology

## 2013-02-20 DIAGNOSIS — Z302 Encounter for sterilization: Secondary | ICD-10-CM

## 2013-02-20 DIAGNOSIS — Z9851 Tubal ligation status: Secondary | ICD-10-CM

## 2013-02-20 HISTORY — DX: Tubal ligation status: Z98.51

## 2013-02-20 HISTORY — DX: Breast implant status: Z98.82

## 2013-02-20 LAB — CBC
MCH: 26.7 pg (ref 26.0–34.0)
MCV: 81.3 fL (ref 78.0–100.0)
Platelets: 184 10*3/uL (ref 150–400)
RDW: 13.9 % (ref 11.5–15.5)

## 2013-02-20 MED ORDER — BENZOCAINE-MENTHOL 20-0.5 % EX AERO
1.0000 "application " | INHALATION_SPRAY | CUTANEOUS | Status: DC | PRN
  Filled 2013-02-20: qty 56

## 2013-02-20 MED ORDER — TETANUS-DIPHTH-ACELL PERTUSSIS 5-2.5-18.5 LF-MCG/0.5 IM SUSP: 0.5000 mL | Freq: Once | INTRAMUSCULAR | Status: DC

## 2013-02-20 MED ORDER — LACTATED RINGERS IV SOLN: 500.0000 mL | INTRAVENOUS | Status: DC | PRN

## 2013-02-20 MED ORDER — DIPHENHYDRAMINE HCL 50 MG/ML IJ SOLN: 12.5000 mg | INTRAMUSCULAR | Status: DC | PRN

## 2013-02-20 MED ORDER — ONDANSETRON HCL 4 MG PO TABS: 4.0000 mg | ORAL_TABLET | ORAL | Status: DC | PRN

## 2013-02-20 MED ORDER — LIDOCAINE HCL (PF) 1 % IJ SOLN
30.0000 mL | INTRAMUSCULAR | Status: DC | PRN
  Filled 2013-02-20 (×2): qty 30

## 2013-02-20 MED ORDER — EPHEDRINE 5 MG/ML INJ
10.0000 mg | INTRAVENOUS | Status: DC | PRN
  Filled 2013-02-20: qty 4
  Filled 2013-02-20: qty 2

## 2013-02-20 MED ORDER — DIPHENHYDRAMINE HCL 25 MG PO CAPS: 25.0000 mg | ORAL_CAPSULE | Freq: Four times a day (QID) | ORAL | Status: DC | PRN

## 2013-02-20 MED ORDER — SIMETHICONE 80 MG PO CHEW: 80.0000 mg | CHEWABLE_TABLET | ORAL | Status: DC | PRN

## 2013-02-20 MED ORDER — FLEET ENEMA 7-19 GM/118ML RE ENEM: 1.0000 | ENEMA | RECTAL | Status: DC | PRN

## 2013-02-20 MED ORDER — ZOLPIDEM TARTRATE 5 MG PO TABS: 5.0000 mg | ORAL_TABLET | Freq: Every evening | ORAL | Status: DC | PRN

## 2013-02-20 MED ORDER — DIBUCAINE 1 % RE OINT: 1.0000 "application " | TOPICAL_OINTMENT | RECTAL | Status: DC | PRN

## 2013-02-20 MED ORDER — CITRIC ACID-SODIUM CITRATE 334-500 MG/5ML PO SOLN: 30.0000 mL | ORAL | Status: DC | PRN

## 2013-02-20 MED ORDER — FAMOTIDINE 20 MG PO TABS
40.0000 mg | ORAL_TABLET | Freq: Once | ORAL | Status: AC
  Administered 2013-02-21: 40 mg via ORAL
  Filled 2013-02-20: qty 2

## 2013-02-20 MED ORDER — OXYTOCIN 40 UNITS IN LACTATED RINGERS INFUSION - SIMPLE MED: 62.5000 mL/h | INTRAVENOUS | Status: DC

## 2013-02-20 MED ORDER — LACTATED RINGERS IV SOLN
INTRAVENOUS | Status: DC
  Administered 2013-02-21: 11:00:00 via INTRAVENOUS

## 2013-02-20 MED ORDER — EPHEDRINE 5 MG/ML INJ
10.0000 mg | INTRAVENOUS | Status: DC | PRN
  Filled 2013-02-20: qty 2

## 2013-02-20 MED ORDER — OXYTOCIN 40 UNITS IN LACTATED RINGERS INFUSION - SIMPLE MED: 62.5000 mL/h | INTRAVENOUS | Status: DC | PRN

## 2013-02-20 MED ORDER — PHENYLEPHRINE 40 MCG/ML (10ML) SYRINGE FOR IV PUSH (FOR BLOOD PRESSURE SUPPORT)
80.0000 ug | PREFILLED_SYRINGE | INTRAVENOUS | Status: DC | PRN
  Filled 2013-02-20: qty 2

## 2013-02-20 MED ORDER — WITCH HAZEL-GLYCERIN EX PADS: 1.0000 "application " | MEDICATED_PAD | CUTANEOUS | Status: DC | PRN

## 2013-02-20 MED ORDER — OXYCODONE-ACETAMINOPHEN 5-325 MG PO TABS: 1.0000 | ORAL_TABLET | ORAL | Status: DC | PRN

## 2013-02-20 MED ORDER — IBUPROFEN 600 MG PO TABS
600.0000 mg | ORAL_TABLET | Freq: Four times a day (QID) | ORAL | Status: DC
  Administered 2013-02-20 – 2013-02-22 (×6): 600 mg via ORAL
  Filled 2013-02-20 (×6): qty 1

## 2013-02-20 MED ORDER — LIDOCAINE HCL (PF) 1 % IJ SOLN
INTRAMUSCULAR | Status: DC | PRN
  Administered 2013-02-20 (×2): 4 mL

## 2013-02-20 MED ORDER — SENNOSIDES-DOCUSATE SODIUM 8.6-50 MG PO TABS
2.0000 | ORAL_TABLET | Freq: Every day | ORAL | Status: DC
  Administered 2013-02-20 – 2013-02-21 (×2): 2 via ORAL

## 2013-02-20 MED ORDER — METOCLOPRAMIDE HCL 10 MG PO TABS
10.0000 mg | ORAL_TABLET | Freq: Once | ORAL | Status: AC
  Administered 2013-02-21: 10 mg via ORAL
  Filled 2013-02-20: qty 1

## 2013-02-20 MED ORDER — LACTATED RINGERS IV SOLN
INTRAVENOUS | Status: DC
  Administered 2013-02-20 (×2): via INTRAVENOUS

## 2013-02-20 MED ORDER — OXYCODONE-ACETAMINOPHEN 5-325 MG PO TABS
1.0000 | ORAL_TABLET | ORAL | Status: DC | PRN
  Administered 2013-02-21 – 2013-02-22 (×3): 1 via ORAL
  Filled 2013-02-20 (×3): qty 1

## 2013-02-20 MED ORDER — FENTANYL 2.5 MCG/ML BUPIVACAINE 1/10 % EPIDURAL INFUSION (WH - ANES)
INTRAMUSCULAR | Status: DC | PRN
  Administered 2013-02-20: 14.5 mL/h via EPIDURAL

## 2013-02-20 MED ORDER — LACTATED RINGERS IV SOLN
500.0000 mL | Freq: Once | INTRAVENOUS | Status: AC
  Administered 2013-02-20: 500 mL via INTRAVENOUS

## 2013-02-20 MED ORDER — LANOLIN HYDROUS EX OINT: TOPICAL_OINTMENT | CUTANEOUS | Status: DC | PRN

## 2013-02-20 MED ORDER — OXYTOCIN BOLUS FROM INFUSION
500.0000 mL | INTRAVENOUS | Status: DC
  Administered 2013-02-20: 500 mL via INTRAVENOUS

## 2013-02-20 MED ORDER — ONDANSETRON HCL 4 MG/2ML IJ SOLN: 4.0000 mg | Freq: Four times a day (QID) | INTRAMUSCULAR | Status: DC | PRN

## 2013-02-20 MED ORDER — IBUPROFEN 600 MG PO TABS: 600.0000 mg | ORAL_TABLET | Freq: Four times a day (QID) | ORAL | Status: DC | PRN

## 2013-02-20 MED ORDER — ONDANSETRON HCL 4 MG/2ML IJ SOLN: 4.0000 mg | INTRAMUSCULAR | Status: DC | PRN

## 2013-02-20 MED ORDER — PRENATAL MULTIVITAMIN CH: 1.0000 | ORAL_TABLET | Freq: Every day | ORAL | Status: DC

## 2013-02-20 MED ORDER — TERBUTALINE SULFATE 1 MG/ML IJ SOLN: 0.2500 mg | Freq: Once | INTRAMUSCULAR | Status: DC | PRN

## 2013-02-20 MED ORDER — OXYTOCIN 40 UNITS IN LACTATED RINGERS INFUSION - SIMPLE MED
1.0000 m[IU]/min | INTRAVENOUS | Status: DC
  Administered 2013-02-20: 2 m[IU]/min via INTRAVENOUS
  Filled 2013-02-20: qty 1000

## 2013-02-20 MED ORDER — FENTANYL 2.5 MCG/ML BUPIVACAINE 1/10 % EPIDURAL INFUSION (WH - ANES)
14.0000 mL/h | INTRAMUSCULAR | Status: DC | PRN
  Filled 2013-02-20: qty 125

## 2013-02-20 MED ORDER — ACETAMINOPHEN 325 MG PO TABS: 650.0000 mg | ORAL_TABLET | ORAL | Status: DC | PRN

## 2013-02-20 MED ORDER — PHENYLEPHRINE 40 MCG/ML (10ML) SYRINGE FOR IV PUSH (FOR BLOOD PRESSURE SUPPORT)
80.0000 ug | PREFILLED_SYRINGE | INTRAVENOUS | Status: DC | PRN
  Filled 2013-02-20: qty 2
  Filled 2013-02-20: qty 5

## 2013-02-20 NOTE — Anesthesia Preprocedure Evaluation (Signed)
Anesthesia Evaluation  Patient identified by MRN, date of birth, ID band Patient awake    Reviewed: Allergy & Precautions, H&P , Patient's Chart, lab work & pertinent test results  Airway Mallampati: III TM Distance: >3 FB Neck ROM: full    Dental no notable dental hx. (+) Teeth Intact   Pulmonary asthma ,  breath sounds clear to auscultation  Pulmonary exam normal       Cardiovascular + Valvular Problems/Murmurs Rhythm:regular Rate:Normal     Neuro/Psych negative neurological ROS  negative psych ROS   GI/Hepatic negative GI ROS, Neg liver ROS,   Endo/Other  negative endocrine ROS  Renal/GU negative Renal ROS  negative genitourinary   Musculoskeletal   Abdominal   Peds  Hematology negative hematology ROS (+)   Anesthesia Other Findings   Reproductive/Obstetrics (+) Pregnancy                           Anesthesia Physical Anesthesia Plan  ASA: II  Anesthesia Plan: Epidural   Post-op Pain Management:    Induction:   Airway Management Planned:   Additional Equipment:   Intra-op Plan:   Post-operative Plan:   Informed Consent: I have reviewed the patients History and Physical, chart, labs and discussed the procedure including the risks, benefits and alternatives for the proposed anesthesia with the patient or authorized representative who has indicated his/her understanding and acceptance.     Plan Discussed with: Anesthesiologist  Anesthesia Plan Comments:         Anesthesia Quick Evaluation

## 2013-02-20 NOTE — H&P (Signed)
Subjective:  Tina Turner is a 32 y.o. G2 P1 female with EDC 02/25/2013 at 4 and 2/[redacted] weeks gestation who is being admitted for induction of labor.  Her current obstetrical history is normal.  Patient reports no complaints.   Fetal Movement: normal.     Objective:   Vital signs in last 24 hours: Temp:  [97.5 F (36.4 C)-97.8 F (36.6 C)] 97.8 F (36.6 C) (07/23 1201) Pulse Rate:  [60-84] 69 (07/23 1431) Resp:  [14-18] 14 (07/23 1401) BP: (114-135)/(68-87) 114/79 mmHg (07/23 1431) SpO2:  [99 %-100 %] 99 % (07/23 0954) Weight:  [71.668 kg (158 lb)] 71.668 kg (158 lb) (07/23 0742)   General:   alert  Skin:   normal  HEENT:  PERRLA  Lungs:   clear to auscultation bilaterally  Heart:   regular rate and rhythm  Breasts:   Deferred  Abdomen:  Gravid  Pelvis:  Exam deferred.  FHT:  130-140 BPM, accelerations pos, no deceleration  Uterine Size: size equals dates  Presentations: cephalic  Cervix:    Dilation: 7 cm   Effacement: 90%   Station:  +1   Consistency: soft   Position: anterior                               Clear abundant AF post AROM. Lab Review  A, Rh+  AFP:NML, [place] anato wnl  One hour GTT: Normal    Assessment/Plan:  39 and 2/[redacted] weeks gestation.  Induction re favorable cervix at term, previous SVD. Active phase labor on pitocin.  AROM AF clear.  Fetal well-being reassuring.  Well under epidural. Obstetrical history is normal.     Risks, benefits, alternatives and possible complications have been discussed in detail with the patient.  Pre-admission, admission, and post admission procedures and expectations were discussed in detail.  All questions answered, all appropriate consents will be signed at the Hospital. Admission is planned for today.  Expectant management.

## 2013-02-20 NOTE — Anesthesia Procedure Notes (Signed)
Epidural Patient location during procedure: OB Start time: 02/20/2013 9:24 AM  Staffing Anesthesiologist: Tiena Manansala A. Performed by: anesthesiologist   Preanesthetic Checklist Completed: patient identified, site marked, surgical consent, pre-op evaluation, timeout performed, IV checked, risks and benefits discussed and monitors and equipment checked  Epidural Patient position: sitting Prep: site prepped and draped and DuraPrep Patient monitoring: continuous pulse ox and blood pressure Approach: midline Injection technique: LOR air  Needle:  Needle type: Tuohy  Needle gauge: 17 G Needle length: 9 cm and 9 Needle insertion depth: 5 cm cm Catheter type: closed end flexible Catheter size: 19 Gauge Catheter at skin depth: 10 cm Test dose: negative and Other  Assessment Events: blood not aspirated, injection not painful, no injection resistance, negative IV test and no paresthesia  Additional Notes Patient identified. Risks and benefits discussed including failed block, incomplete  Pain control, post dural puncture headache, nerve damage, paralysis, blood pressure Changes, nausea, vomiting, reactions to medications-both toxic and allergic and post Partum back pain. All questions were answered. Patient expressed understanding and wished to proceed. Sterile technique was used throughout procedure. Epidural site was Dressed with sterile barrier dressing. No paresthesias, signs of intravascular injection Or signs of intrathecal spread were encountered.  Patient was more comfortable after the epidural was dosed. Please see RN's note for documentation of vital signs and FHR which are stable.

## 2013-02-21 ENCOUNTER — Encounter (HOSPITAL_COMMUNITY): Payer: Self-pay | Admitting: Anesthesiology

## 2013-02-21 ENCOUNTER — Encounter (HOSPITAL_COMMUNITY): Payer: Self-pay

## 2013-02-21 ENCOUNTER — Encounter (HOSPITAL_COMMUNITY)
Admission: RE | Disposition: A | Discharge status: Home / Self Care | Payer: Self-pay | Source: Ambulatory Visit | Attending: Obstetrics & Gynecology

## 2013-02-21 ENCOUNTER — Inpatient Hospital Stay (HOSPITAL_COMMUNITY): Payer: PRIVATE HEALTH INSURANCE | Admitting: Anesthesiology

## 2013-02-21 HISTORY — PX: TUBAL LIGATION: SHX77

## 2013-02-21 LAB — CBC
MCH: 26.8 pg (ref 26.0–34.0)
MCHC: 32.7 g/dL (ref 30.0–36.0)
MCV: 81.8 fL (ref 78.0–100.0)
Platelets: 174 10*3/uL (ref 150–400)
RBC: 3.85 MIL/uL — ABNORMAL LOW (ref 3.87–5.11)

## 2013-02-21 SURGERY — LIGATION, FALLOPIAN TUBE, POSTPARTUM
Anesthesia: Epidural | Site: Abdomen | Laterality: Bilateral | Wound class: Clean Contaminated

## 2013-02-21 MED ORDER — SODIUM BICARBONATE 8.4 % IV SOLN
INTRAVENOUS | Status: DC | PRN
  Administered 2013-02-21: 3 mL via EPIDURAL

## 2013-02-21 MED ORDER — MIDAZOLAM HCL 5 MG/5ML IJ SOLN
INTRAMUSCULAR | Status: DC | PRN
  Administered 2013-02-21: 1 mg via INTRAVENOUS
  Administered 2013-02-21 (×2): 0.5 mg via INTRAVENOUS

## 2013-02-21 MED ORDER — HYDROMORPHONE HCL PF 1 MG/ML IJ SOLN: 0.2500 mg | INTRAMUSCULAR | Status: DC | PRN

## 2013-02-21 MED ORDER — CEFAZOLIN SODIUM-DEXTROSE 2-3 GM-% IV SOLR
INTRAVENOUS | Status: AC
  Filled 2013-02-21: qty 50

## 2013-02-21 MED ORDER — CEFAZOLIN SODIUM-DEXTROSE 2-3 GM-% IV SOLR
INTRAVENOUS | Status: DC | PRN
  Administered 2013-02-21: 2 g via INTRAVENOUS

## 2013-02-21 MED ORDER — LIDOCAINE HCL (CARDIAC) 20 MG/ML IV SOLN
INTRAVENOUS | Status: AC
  Filled 2013-02-21: qty 5

## 2013-02-21 MED ORDER — KETOROLAC TROMETHAMINE 30 MG/ML IJ SOLN
INTRAMUSCULAR | Status: AC
  Filled 2013-02-21: qty 1

## 2013-02-21 MED ORDER — KETOROLAC TROMETHAMINE 30 MG/ML IJ SOLN: 15.0000 mg | Freq: Once | INTRAMUSCULAR | Status: AC | PRN

## 2013-02-21 MED ORDER — PROPOFOL 10 MG/ML IV EMUL
INTRAVENOUS | Status: AC
  Filled 2013-02-21: qty 20

## 2013-02-21 MED ORDER — MEPERIDINE HCL 25 MG/ML IJ SOLN: 6.2500 mg | INTRAMUSCULAR | Status: DC | PRN

## 2013-02-21 MED ORDER — FENTANYL CITRATE 0.05 MG/ML IJ SOLN
INTRAMUSCULAR | Status: DC | PRN
  Administered 2013-02-21: 100 ug via INTRAVENOUS

## 2013-02-21 MED ORDER — LIDOCAINE-EPINEPHRINE (PF) 2 %-1:200000 IJ SOLN
INTRAMUSCULAR | Status: AC
  Filled 2013-02-21: qty 20

## 2013-02-21 MED ORDER — SODIUM BICARBONATE 8.4 % IV SOLN
INTRAVENOUS | Status: AC
  Filled 2013-02-21: qty 50

## 2013-02-21 MED ORDER — LIDOCAINE IN DEXTROSE 5-7.5 % IV SOLN
INTRAVENOUS | Status: DC | PRN
  Administered 2013-02-21: 20 mL via INTRATHECAL

## 2013-02-21 MED ORDER — FENTANYL CITRATE 0.05 MG/ML IJ SOLN
INTRAMUSCULAR | Status: AC
  Filled 2013-02-21: qty 2

## 2013-02-21 MED ORDER — PROPOFOL 10 MG/ML IV BOLUS
INTRAVENOUS | Status: DC | PRN
  Administered 2013-02-21: 20 mg via INTRAVENOUS
  Administered 2013-02-21: 10 mg via INTRAVENOUS

## 2013-02-21 MED ORDER — BUPIVACAINE HCL (PF) 0.25 % IJ SOLN
INTRAMUSCULAR | Status: AC
  Filled 2013-02-21: qty 30

## 2013-02-21 MED ORDER — MIDAZOLAM HCL 2 MG/2ML IJ SOLN
INTRAMUSCULAR | Status: AC
  Filled 2013-02-21: qty 2

## 2013-02-21 MED ORDER — PROMETHAZINE HCL 25 MG/ML IJ SOLN: 6.2500 mg | INTRAMUSCULAR | Status: DC | PRN

## 2013-02-21 MED ORDER — BUPIVACAINE HCL (PF) 0.25 % IJ SOLN
INTRAMUSCULAR | Status: DC | PRN
  Administered 2013-02-21: 20 mL

## 2013-02-21 SURGICAL SUPPLY — 24 items
BENZOIN TINCTURE PRP APPL 2/3 (GAUZE/BANDAGES/DRESSINGS) ×2 IMPLANT
CLOTH BEACON ORANGE TIMEOUT ST (SAFETY) ×2 IMPLANT
CONTAINER PREFILL 10% NBF 15ML (MISCELLANEOUS) ×4 IMPLANT
DERMABOND ADVANCED (GAUZE/BANDAGES/DRESSINGS) ×1
DERMABOND ADVANCED .7 DNX12 (GAUZE/BANDAGES/DRESSINGS) ×1 IMPLANT
ELECT REM PT RETURN 9FT ADLT (ELECTROSURGICAL) ×2
ELECTRODE REM PT RTRN 9FT ADLT (ELECTROSURGICAL) ×1 IMPLANT
GLOVE BIO SURGEON STRL SZ 6.5 (GLOVE) ×2 IMPLANT
GLOVE BIOGEL PI IND STRL 7.0 (GLOVE) ×1 IMPLANT
GLOVE BIOGEL PI INDICATOR 7.0 (GLOVE) ×1
GOWN PREVENTION PLUS LG XLONG (DISPOSABLE) ×4 IMPLANT
NEEDLE HYPO 25X1 1.5 SAFETY (NEEDLE) ×2 IMPLANT
PACK ABDOMINAL MINOR (CUSTOM PROCEDURE TRAY) ×2 IMPLANT
PENCIL BUTTON HOLSTER BLD 10FT (ELECTRODE) ×2 IMPLANT
SPONGE LAP 4X18 X RAY DECT (DISPOSABLE) IMPLANT
STRIP CLOSURE SKIN 1/4X3 (GAUZE/BANDAGES/DRESSINGS) ×2 IMPLANT
SUT PLAIN 0 NONE (SUTURE) ×2 IMPLANT
SUT VIC AB 3-0 PS2 18 (SUTURE) ×1
SUT VIC AB 3-0 PS2 18XBRD (SUTURE) ×1 IMPLANT
SUT VICRYL 0 UR6 27IN ABS (SUTURE) ×2 IMPLANT
SYR CONTROL 10ML LL (SYRINGE) ×2 IMPLANT
TOWEL OR 17X24 6PK STRL BLUE (TOWEL DISPOSABLE) ×4 IMPLANT
TRAY FOLEY CATH 14FR (SET/KITS/TRAYS/PACK) ×2 IMPLANT
WATER STERILE IRR 1000ML POUR (IV SOLUTION) ×4 IMPLANT

## 2013-02-21 NOTE — Progress Notes (Signed)
Normal PP evolution. Epidural in place. NPO. Informed consent obtained for PP BT/S modified [place]oy procedure.  Genia Del MD  02/22/2013

## 2013-02-21 NOTE — Anesthesia Postprocedure Evaluation (Signed)
  Anesthesia Post-op Note  Patient: Tina Turner  Procedure(s) Performed: Procedure(s): POST PARTUM TUBAL LIGATION (Bilateral)  Patient Location: PACU  Anesthesia Type:Spinal  Level of Consciousness: awake, alert  and oriented  Airway and Oxygen Therapy: Patient Spontanous Breathing  Post-op Pain: none  Post-op Assessment: Post-op Vital signs reviewed, Patient's Cardiovascular Status Stable, Respiratory Function Stable, Patent Airway, No signs of Nausea or vomiting, Pain level controlled, No headache and No backache  Post-op Vital Signs: Reviewed and stable  Complications: No apparent anesthesia complications

## 2013-02-21 NOTE — Anesthesia Postprocedure Evaluation (Signed)
  Anesthesia Post-op Note  Anesthesia Post Note  Patient: Tina Turner  Procedure(s) Performed: Procedure(s) (LRB): POST PARTUM TUBAL LIGATION (Bilateral)  Anesthesia type: Epidural  Patient location: Mother/Baby  Post pain: Pain level controlled  Post assessment: Post-op Vital signs reviewed  Last Vitals:  Filed Vitals:   02/21/13 1355  BP: 112/77  Pulse: 68  Temp: 36.6 C  Resp: 17    Post vital signs: Reviewed  Level of consciousness:alert  Complications: No apparent anesthesia complications

## 2013-02-21 NOTE — Progress Notes (Addendum)
PPD # 1 S/p SVD with 2nd degree lac. For BTL today; epidural catheter left in place Subjective: Pt reports feeling well/ Pain controlled with ibuprofen Tolerating po/ Voiding without problems/ No n/v Bleeding is light Newborn info:  Information for the patient's newborn:  Dicy, Smigel Girl Lorenia [[id number]]  female Feeding: bottle   Objective:  VS: Blood pressure 98/59, pulse 70, temperature 97.8 F (36.6 C), temperature source Oral, resp. rate 18, height 5\' 7"  (1.702 m), weight 158 lb (71.668 kg), SpO2 100.00%, unknown if currently breastfeeding.    Recent Labs  02/20/13 0735 02/21/13 0555  WBC 6.7 8.9  HGB 12.3 10.3*  HCT 37.4 31.5*  PLT 184 174    Blood type: A/Positive/-- (01/03 0000) Rubella: Immune (01/03 0000)    Physical Exam:  General:  alert, cooperative and no distress CV: Regular rate and rhythm Resp: clear Abdomen: soft, nontender, normal bowel sounds Uterine Fundus: firm, below umbilicus, nontender Perineum: healing with good reapproximation Lochia: minimal Ext: extremities normal, atraumatic, no cyanosis or edema and edema trace   A/P: PPD # 1/ G2P2002/ S/P:spontaneous vaginal delivery with 2nd deg lac Doing well Continue routine post partum orders BTL today with Dr. Seymour Bars at 2:30 pm Anticipate D/C home in AM    TEDDER, Elisha Headland, FNP Student 02/21/2013, 9:20 AM  Reviewed: Arlana Lindau, MSN, [facility] 02/21/13 929am

## 2013-02-21 NOTE — Anesthesia Preprocedure Evaluation (Signed)
Anesthesia Evaluation  Patient identified by MRN, date of birth, ID band Patient awake    Reviewed: Allergy & Precautions, H&P , Patient's Chart, lab work & pertinent test results  Airway Mallampati: III TM Distance: >3 FB Neck ROM: full    Dental no notable dental hx. (+) Teeth Intact   Pulmonary asthma ,  breath sounds clear to auscultation  Pulmonary exam normal       Cardiovascular + Valvular Problems/Murmurs Rhythm:regular Rate:Normal     Neuro/Psych negative neurological ROS  negative psych ROS   GI/Hepatic negative GI ROS, Neg liver ROS,   Endo/Other  negative endocrine ROS  Renal/GU negative Renal ROS  negative genitourinary   Musculoskeletal   Abdominal   Peds  Hematology negative hematology ROS (+)   Anesthesia Other Findings   Reproductive/Obstetrics (+) Pregnancy                           Anesthesia Physical  Anesthesia Plan  ASA: II  Anesthesia Plan: Epidural   Post-op Pain Management:    Induction:   Airway Management Planned:   Additional Equipment:   Intra-op Plan:   Post-operative Plan:   Informed Consent: I have reviewed the patients History and Physical, chart, labs and discussed the procedure including the risks, benefits and alternatives for the proposed anesthesia with the patient or authorized representative who has indicated his/her understanding and acceptance.     Plan Discussed with: CRNA and Surgeon  Anesthesia Plan Comments: (For PPTL with epidural in-situ.)        Anesthesia Quick Evaluation

## 2013-02-21 NOTE — Op Note (Signed)
02/20/2013 - 02/21/2013  12:07 PM  PATIENT:  Tina Turner  32 y.o. female  PRE-OPERATIVE DIAGNOSIS:  desire sterilization  POST-OPERATIVE DIAGNOSIS:  desire sterilization  PROCEDURE:  Procedure(s): POST PARTUM TUBAL LIGATION BY MODIFIED POMEROY PROCEDURE  SURGEON:  Surgeon(s): Genia Del, MD  ASSISTANTS: none   ANESTHESIA:   epidural  ESTIMATED BLOOD LOSS: 5 cc   Intake/Output Summary (Last 24 hours) at 02/21/13 1207 Last data filed at 02/21/13 1200  Gross per 24 hour  Intake      0 ml  Output    555 ml  Net   -555 ml     BLOOD ADMINISTERED:none   LOCAL MEDICATIONS USED:  MARCAINE     SPECIMEN:  Source of Specimen:  Right and left portions of tubes  DISPOSITION OF SPECIMEN:  PATHOLOGY  COUNTS:  YES  DICTATION #:  [id number]  PLAN OF CARE: Transfer to PACU  Genia Del MD  02/22/2013 at 12:08 pm

## 2013-02-21 NOTE — Transfer of Care (Signed)
Immediate Anesthesia Transfer of Care Note  Patient: Tina Turner  Procedure(s) Performed: Procedure(s): POST PARTUM TUBAL LIGATION (Bilateral)  Patient Location: PACU  Anesthesia Type:Epidural  Level of Consciousness: awake, alert , oriented and patient cooperative  Airway & Oxygen Therapy: Patient Spontanous Breathing  Post-op Assessment: Report given to PACU RN and Post -op Vital signs reviewed and stable  Post vital signs: Reviewed and stable  Complications: No apparent anesthesia complications

## 2013-02-21 NOTE — Anesthesia Postprocedure Evaluation (Signed)
Anesthesia Post Note  Patient: Tina Turner  Procedure(s) Performed: * No procedures listed *  Anesthesia type: Epidural  Patient location: Mother/Baby  Post pain: Pain level controlled  Post assessment: Post-op Vital signs reviewed  Last Vitals:  Filed Vitals:   02/21/13 0522  BP: 98/59  Pulse: 70  Temp: 36.6 C  Resp: 18    Post vital signs: Reviewed  Level of consciousness: awake  Complications: No apparent anesthesia complications

## 2013-02-22 ENCOUNTER — Encounter (HOSPITAL_COMMUNITY): Payer: Self-pay

## 2013-02-22 DIAGNOSIS — Z9851 Tubal ligation status: Secondary | ICD-10-CM

## 2013-02-22 HISTORY — DX: Tubal ligation status: Z98.51

## 2013-02-22 MED ORDER — IBUPROFEN 600 MG PO TABS: 600.0000 mg | ORAL_TABLET | Freq: Four times a day (QID) | ORAL | Status: AC

## 2013-02-22 MED ORDER — OXYCODONE-ACETAMINOPHEN 5-325 MG PO TABS: 1.0000 | ORAL_TABLET | Freq: Four times a day (QID) | ORAL | Status: DC | PRN

## 2013-02-22 NOTE — Op Note (Signed)
Tina Turner, Tina Turner NO.:  [id number]  MEDICAL RECORD NO.:  [id number]  LOCATION:  9109                          FACILITY:  [facility]  PHYSICIAN:  Genia Del, M.D.DATE OF BIRTH:  [birth year]  DATE OF PROCEDURE: DATE OF DISCHARGE:                              OPERATIVE REPORT   PREOPERATIVE DIAGNOSIS:  Desire for sterilization.  POSTOPERATIVE DIAGNOSIS:  Desire for sterilization.  INTERVENTION:  Postpartum bilateral tubal ligation by modified Pomeroy procedure.  ASSISTANT:  No assistant.  ANESTHESIA:  Epidural.  DESCRIPTION OF PROCEDURE:  Under epidural anesthesia, the patient is in lithotomy position.  She was prepped with ChloraPrep on the abdomen. She was draped as usual.  A Foley is in place in the bladder.  We infiltrated the subcutaneous tissue with Marcaine 0.25% plain, 10 mL. We made an infraumbilical incision with a scalpel over 2.5 cm.  The aponeurosis was opened with Mayo scissors under direct vision, and the parietal peritoneum was opened under direct vision with Metzenbaum scissors.  We then used the [employer] retractors to locate the left tube first.  We grasped it with the Campbell, and with another Tanja Port we identified the fimbriae.  Once the tube was well identified, we grasped it with 2 Babcocks and opened the mesosalpinx with cautery.  We used plain at 2-0 to suture the tube distally and then in another plane 2-0 to suture the tube proximally at about 2 cm from the cornua.  We then resected a portion of the tube in between the 2 sutures and sent it to pathology.  We then cauterized the tips of the cut tubes on either side with the cautery.  Hemostasis is adequate.  We proceeded exactly the same way on the right side and confirmed good hemostasis.  We then closed the aponeurosis with a running suture of Vicryl 0.  We closed the skin with a subcuticular stitch of Vicryl 4-0 and added Dermabond. Hemostasis was adequate.  The count of  instruments and sponges was complete.  ESTIMATED BLOOD LOSS:  5 mL.  COMPLICATIONS:  No complications occurred, and the patient was brought to recovery room in good and stable status.     Genia Del, M.D.    ML/MEDQ  D:  02/21/2013  T:  02/22/2013  Job:  [id number]

## 2013-02-22 NOTE — Discharge Summary (Signed)
Obstetric Discharge Summary Reason for Admission: induction of labor Prenatal Procedures: none Intrapartum Procedures: spontaneous vaginal delivery Postpartum Procedures: P.P. tubal ligation Complications-Operative and Postpartum: 2nd degree perineal laceration Hemoglobin  Date Value Range Status  02/21/2013 10.3* 12.0 - 15.0 g/dL Final     HCT  Date Value Range Status  02/21/2013 31.5* 36.0 - 46.0 % Final    Physical Exam:  General: alert, cooperative and no distress Lochia: appropriate Uterine Fundus: firm, midline, U-2 Incision: healing well, Dermabond in place, no drainage - mildly tender with palpation DVT Evaluation: No evidence of DVT seen on physical exam. Negative Homan's sign. No cords or calf tenderness. No significant calf/ankle edema.  Discharge Diagnoses: Term Pregnancy-delivered, S/P Tubal Ligation  Discharge Information: Date: 02/22/2013 Activity: pelvic rest Diet: routine Medications: PNV, Ibuprofen and Percocet Condition: stable Instructions: refer to practice specific booklet Discharge to: home   Newborn Data: Live born female  Birth Weight: 7 lb 12 oz (3515 g) APGAR: 8, 9  Home with mother.  Kenard Gower, MSN, CNM 02/22/2013, 9:47 AM

## 2013-02-22 NOTE — Progress Notes (Signed)
Patient ID: Tina Turner, female   DOB: Nov 20, 1980, 32 y.o.   MRN: [id number] Post Partum Day #2            Information for the patient's newborn:  Lillyauna, Jenkinson Girl Mayola [[id number]]  female  Feeding: breast  Subjective: No HA, SOB, CP, F/C, breast symptoms. Pain controlled with ibuprofen and Percocet. Normal vaginal bleeding, no clots.      Objective:  Temp:  [97.1 F (36.2 C)-98.3 F (36.8 C)] 98.2 F (36.8 C) (07/25 0634) Pulse Rate:  [60-79] 63 (07/25 0634) Resp:  [14-20] 18 (07/25 0634) BP: (100-127)/(58-83) 108/75 mmHg (07/25 0634) SpO2:  [96 %-100 %] 98 % (07/25 0634)     Recent Labs  02/20/13 0735 02/21/13 0555  WBC 6.7 8.9  HGB 12.3 10.3*  HCT 37.4 31.5*  PLT 184 174    Blood type: A/Positive/-- (01/03 0000) Rubella: Immune (01/03 0000)    Physical Exam:  General: alert, cooperative and no distress Uterine Fundus: firm Incision: clean, dry and intact Dermabond in place - mildly tender to palpation Lochia: appropriate Perineum: repair, no edema  DVT Evaluation: No evidence of DVT seen on physical exam. Negative Homan's sign. No cords or calf tenderness. No significant calf/ankle edema.    Assessment/Plan: PPD # 2 / 32 y.o., [id number] S/P: spontaneous vaginal delivery and postpartum BTL   Principal Problem:   Postpartum care following vaginal delivery (02/20/13) Active Problems:   S/P tubal ligation (7/24)   SVD (spontaneous vaginal delivery)   normal postpartum exam  normal post-operative exam  Continue current postpartum care  D/C home   LOS: 2 days   Raelyn Mora, M, MSN, CNM 02/22/2013, 9:35 AM

## 2013-05-31 ENCOUNTER — Ambulatory Visit (INDEPENDENT_AMBULATORY_CARE_PROVIDER_SITE_OTHER): Payer: PRIVATE HEALTH INSURANCE | Admitting: Nurse Practitioner

## 2013-05-31 VITALS — BP 122/80 | HR 84 | Temp 99.1°F | Resp 16 | Ht 66.5 in | Wt 135.0 lb

## 2013-05-31 DIAGNOSIS — J069 Acute upper respiratory infection, unspecified: Secondary | ICD-10-CM

## 2013-05-31 MED ORDER — DOXYCYCLINE HYCLATE 100 MG PO TABS: 100.0000 mg | ORAL_TABLET | Freq: Two times a day (BID) | ORAL | Status: DC

## 2013-05-31 NOTE — Progress Notes (Signed)
  Subjective:    Patient ID: Tina Turner, female    DOB: 1981/07/26, 32 y.o.   MRN: [id number]  Sinus Problem This is a recurrent (pt reports pneumonia last spring & twice last year.) problem. The current episode started in the past 7 days. The problem has been gradually worsening since onset. The maximum temperature recorded prior to her arrival was 100 - 100.9 F. Associated symptoms include chills, congestion, coughing, ear pain, headaches, a hoarse voice, sinus pressure and a sore throat. Pertinent negatives include no shortness of breath. Past treatments include nothing.  Fever  Associated symptoms include congestion, coughing, ear pain, headaches and a sore throat. Pertinent negatives include no abdominal pain, chest pain or wheezing.      Review of Systems  Constitutional: Positive for fever and chills.  HENT: Positive for congestion, ear pain, hoarse voice, sinus pressure and sore throat.   Eyes: Negative for redness.  Respiratory: Positive for cough. Negative for chest tightness, shortness of breath and wheezing.   Cardiovascular: Negative for chest pain.  Gastrointestinal: Negative for abdominal pain.  Neurological: Positive for headaches.       Objective:   Physical Exam  Vitals reviewed. Constitutional: She is oriented to person, place, and time. She appears well-developed and well-nourished. No distress.  HENT:  Head: Normocephalic and atraumatic.  Right Ear: External ear normal. Tympanic membrane is perforated (pt is aware, she had patch in past) and erythematous.  Left Ear: External ear normal.  Mouth/Throat: Posterior oropharyngeal erythema present. No oropharyngeal exudate.  Eyes: Conjunctivae are normal. Pupils are equal, round, and reactive to light. Right eye exhibits no discharge. Left eye exhibits no discharge.  Neck: Normal range of motion. Neck supple. No thyromegaly present.  Cardiovascular: Normal rate, regular rhythm and normal heart sounds.   No  murmur heard. Pulmonary/Chest: Effort normal and breath sounds normal. No respiratory distress. She has no wheezes.  Lymphadenopathy:    She has no cervical adenopathy.  Neurological: She is alert and oriented to person, place, and time.  Skin: Skin is warm and dry.  Psychiatric: She has a normal mood and affect. Her behavior is normal. Thought content normal.          Assessment & Plan:  Upper respiratory infection, red throat, fever, Hx freq pneumonia, asthma Will treat w/doxy to cover strep & CAP. See pt instructions.

## 2013-05-31 NOTE — Patient Instructions (Signed)
I am treating you with an antibiotic because of fever and your history of pneumonia. Eat yogurt daily while taking med. Avoid dairy products within 2 hours of taking medicine. Start daily sinus washes. Sip fluids every hour. Use rescue inhaler as needed. Rest. If feeling worse or no improvement after 5 days, please call for re-eval.

## 2013-09-13 ENCOUNTER — Encounter: Payer: Self-pay | Admitting: Nurse Practitioner

## 2013-09-13 ENCOUNTER — Ambulatory Visit (INDEPENDENT_AMBULATORY_CARE_PROVIDER_SITE_OTHER): Payer: PRIVATE HEALTH INSURANCE | Admitting: Nurse Practitioner

## 2013-09-13 VITALS — BP 99/67 | HR 117 | Temp 98.1°F | Ht 66.5 in | Wt 131.0 lb

## 2013-09-13 DIAGNOSIS — R509 Fever, unspecified: Secondary | ICD-10-CM

## 2013-09-13 DIAGNOSIS — M549 Dorsalgia, unspecified: Secondary | ICD-10-CM

## 2013-09-13 DIAGNOSIS — N10 Acute tubulo-interstitial nephritis: Secondary | ICD-10-CM

## 2013-09-13 LAB — POCT URINALYSIS DIPSTICK
Bilirubin, UA: NEGATIVE
Glucose, UA: NEGATIVE
NITRITE UA: POSITIVE
SPEC GRAV UA: 1.025
UROBILINOGEN UA: NORMAL
pH, UA: 7

## 2013-09-13 MED ORDER — PHENAZOPYRIDINE HCL 200 MG PO TABS: 200.0000 mg | ORAL_TABLET | Freq: Three times a day (TID) | ORAL | Status: DC | PRN

## 2013-09-13 MED ORDER — ONDANSETRON 8 MG PO TBDP: 8.0000 mg | ORAL_TABLET | Freq: Two times a day (BID) | ORAL | Status: AC

## 2013-09-13 MED ORDER — CIPROFLOXACIN HCL 500 MG PO TABS: 500.0000 mg | ORAL_TABLET | Freq: Two times a day (BID) | ORAL | Status: DC

## 2013-09-13 NOTE — Progress Notes (Signed)
Pre-visit discussion using our clinic review tool. No additional management support is needed unless otherwise documented below in the visit note.  

## 2013-09-13 NOTE — Patient Instructions (Signed)
Start antibiotic. Our office will call if we need to change the antibiotic based on the culture. Take pyridium to relax bladder, caution: urine tears & sweat will be orange. Do not be alarmed! Sip hydrating fluids (water, juice, colorless soda, decaff tea) every hour to flush kidneys. Return in 5 days for re-evaluation or sooner if symptoms do not improve or you feel worse.  Urinary Tract Infection Urinary tract infections (UTIs) can develop anywhere along your urinary tract. Your urinary tract is your body's drainage system for removing wastes and extra water. Your urinary tract includes two kidneys, two ureters, a bladder, and a urethra. Your kidneys are a pair of bean-shaped organs. Each kidney is about the size of your fist. They are located below your ribs, one on each side of your spine. CAUSES Infections are caused by microbes, which are microscopic organisms, including fungi, viruses, and bacteria. These organisms are so small that they can only be seen through a microscope. Bacteria are the microbes that most commonly cause UTIs. SYMPTOMS  Symptoms of UTIs may vary by age and gender of the patient and by the location of the infection. Symptoms in young women typically include a frequent and intense urge to urinate and a painful, burning feeling in the bladder or urethra during urination. Older women and men are more likely to be tired, shaky, and weak and have muscle aches and abdominal pain. A fever may mean the infection is in your kidneys. Other symptoms of a kidney infection include pain in your back or sides below the ribs, nausea, and vomiting. DIAGNOSIS To diagnose a UTI, your caregiver will ask you about your symptoms. Your caregiver also will ask to provide a urine sample. The urine sample will be tested for bacteria and white blood cells. White blood cells are made by your body to help fight infection. TREATMENT  Typically, UTIs can be treated with medication. Because most UTIs are  caused by a bacterial infection, they usually can be treated with the use of antibiotics. The choice of antibiotic and length of treatment depend on your symptoms and the type of bacteria causing your infection. HOME CARE INSTRUCTIONS  If you were prescribed antibiotics, take them exactly as your caregiver instructs you. Finish the medication even if you feel better after you have only taken some of the medication.  Drink enough water and fluids to keep your urine clear or pale yellow.  Avoid caffeine, tea, and carbonated beverages. They tend to irritate your bladder.  Empty your bladder often. Avoid holding urine for long periods of time.  Empty your bladder before and after sexual intercourse.  After a bowel movement, women should cleanse from front to back. Use each tissue only once. SEEK MEDICAL CARE IF:   You have back pain.  You develop a fever.  Your symptoms do not begin to resolve within 3 days. SEEK IMMEDIATE MEDICAL CARE IF:   You have severe back pain or lower abdominal pain.  You develop chills.  You have nausea or vomiting.  You have continued burning or discomfort with urination. MAKE SURE YOU:   Understand these instructions.  Will watch your condition.  Will get help right away if you are not doing well or get worse. Document Released: 04/27/2005 Document Revised: 01/17/2012 Document Reviewed: 08/26/2011 [facility] Patient Information 2014 [place], [place].

## 2013-09-13 NOTE — Progress Notes (Signed)
   Subjective:    Patient ID: Tina Turner, female    DOB: [birth year], 33 y.o.   MRN: [id number]  Urinary Tract Infection  This is a new (pt c/o L sided back pain, chills. Had dysuria & frequency several days ago relieved by AZO. Back pain & chills started last night.) problem. The current episode started in the past 7 days (5d). The problem has been gradually worsening. The quality of the pain is described as aching. The pain is moderate. There has been no fever (chills). She is sexually active. There is no history of pyelonephritis. Associated symptoms include chills, flank pain, frequency and nausea. Pertinent negatives include no discharge, hematuria, hesitancy, sweats, urgency or vomiting. Treatments tried: AZO. The treatment provided mild relief.      Review of Systems  Constitutional: Positive for chills. Negative for fever and fatigue.  HENT: Negative for congestion.   Respiratory: Negative for cough and shortness of breath.   Cardiovascular: Negative for chest pain.  Gastrointestinal: Positive for nausea. Negative for vomiting, abdominal pain, diarrhea and constipation.  Genitourinary: Positive for dysuria, frequency and flank pain. Negative for hesitancy, urgency, hematuria, vaginal discharge and pelvic pain.  Musculoskeletal: Positive for back pain (l flank).       Objective:   Physical Exam  Vitals reviewed. Constitutional: She is oriented to person, place, and time. She appears well-developed and well-nourished. No distress.  HENT:  Head: Normocephalic and atraumatic.  Eyes: Conjunctivae are normal. Right eye exhibits no discharge. Left eye exhibits no discharge.  Cardiovascular: Normal rate.   Pulmonary/Chest: Effort normal.  Abdominal: Soft. She exhibits no distension and no mass. There is tenderness (suprapubic & LLQ tenderness). There is no rebound and no guarding.  Musculoskeletal: She exhibits tenderness (l CVA tenderness).  Neurological: She is alert and  oriented to person, place, and time.  Skin: Skin is warm and dry.  Psychiatric: She has a normal mood and affect. Her behavior is normal. Thought content normal.          Assessment & Plan:  1. Back pain, L flank 16 h, getting worse - Urine culture - POCT Urinalysis, Dipstick-pos nites, leuks, blood  2. Fever and chills 3. Acute pyelonephritis cipro 500mg  bid X 7d See pt instructions. F/u 5 d.

## 2013-09-15 LAB — URINE CULTURE

## 2013-09-20 ENCOUNTER — Ambulatory Visit (INDEPENDENT_AMBULATORY_CARE_PROVIDER_SITE_OTHER): Payer: PRIVATE HEALTH INSURANCE | Admitting: Nurse Practitioner

## 2013-09-20 ENCOUNTER — Encounter: Payer: Self-pay | Admitting: Nurse Practitioner

## 2013-09-20 VITALS — BP 109/72 | HR 68 | Temp 98.6°F | Resp 16 | Ht 66.5 in | Wt 127.0 lb

## 2013-09-20 DIAGNOSIS — N10 Acute tubulo-interstitial nephritis: Secondary | ICD-10-CM | POA: Insufficient documentation

## 2013-09-20 DIAGNOSIS — Z09 Encounter for follow-up examination after completed treatment for conditions other than malignant neoplasm: Secondary | ICD-10-CM

## 2013-09-20 LAB — POCT URINALYSIS DIPSTICK
Bilirubin, UA: NEGATIVE
Glucose, UA: NEGATIVE
KETONES UA: NEGATIVE
Leukocytes, UA: NEGATIVE
Nitrite, UA: NEGATIVE
PH UA: 7
PROTEIN UA: NEGATIVE
RBC UA: NEGATIVE
Urobilinogen, UA: 1

## 2013-09-20 NOTE — Patient Instructions (Addendum)
So glad you are feeling better! Please drop in in 3-4 weeks for a lab visit to culture urine-make sure no bacterial growth. There will be no office visit unless results come back that you still have bacterial growth. Urine is concentrated today-please sip fluids every hour for a few days. Stay warm!

## 2013-09-20 NOTE — Progress Notes (Signed)
   Subjective:    Patient ID: Tina HindKristin E Massimino, female    DOB: 12-20-1980, 33 y.o.   MRN: [id number]  HPI Comments: Pt returns for f/u of pyelonephritis.  Urinary Tract Infection  The problem has been rapidly improving. The quality of the pain is described as aching (still has dull ache L flank, reports much better). There has been no fever. Associated symptoms include flank pain. Pertinent negatives include no chills, frequency, hematuria, hesitancy, nausea or vomiting. She has tried antibiotics (finished last cipro last night.) for the symptoms. The treatment provided significant relief.      Review of Systems  Constitutional: Negative for chills.  Gastrointestinal: Negative for nausea, vomiting and abdominal pain.  Genitourinary: Positive for flank pain. Negative for hesitancy, frequency and hematuria.       Objective:   Physical Exam  Vitals reviewed. Constitutional: She is oriented to person, place, and time. She appears well-developed and well-nourished.  HENT:  Head: Normocephalic and atraumatic.  Eyes: Conjunctivae are normal. Right eye exhibits no discharge. Left eye exhibits no discharge.  Cardiovascular: Normal rate.   Pulmonary/Chest: Effort normal.  Abdominal: Soft. She exhibits no distension and no mass. There is tenderness (RUQ). There is no rebound and no guarding.  Musculoskeletal: She exhibits tenderness (L flank).  Neurological: She is alert and oriented to person, place, and time.  Skin: Skin is warm and dry.  Psychiatric: She has a normal mood and affect. Her behavior is normal. Thought content normal.          Assessment & Plan:  1. Follow-up exam Acute pyelonephritis: pt presneted 5da w/fever, chills, flank pain, abd pain, nausea. Started cipro 500 bid X 5d Much improved, but still c/o L flank dull ache, mild RUQ tenderness on exam. - POCT urinalysis dipstick-no leuks, nites, or blood. SG > 1.030 Advised to sip fluids every hour to hydrate. Urine  Cx in 4 weeks.

## 2013-09-20 NOTE — Progress Notes (Signed)
Pre visit review using our clinic review tool, if applicable. No additional management support is needed unless otherwise documented below in the visit note. 

## 2014-06-02 ENCOUNTER — Encounter: Payer: Self-pay | Admitting: Nurse Practitioner

## 2014-12-10 ENCOUNTER — Other Ambulatory Visit: Payer: Self-pay | Admitting: *Deleted

## 2014-12-10 MED ORDER — TYPHOID VACCINE PO CPDR: 1.0000 | DELAYED_RELEASE_CAPSULE | ORAL | Status: AC

## 2014-12-10 NOTE — Telephone Encounter (Signed)
[facility] Dr. Verdis FredericksonMcGowens request.
